# Patient Record
Sex: Female | Born: 1990 | Race: White | Hispanic: No | Smoking: Former smoker
Health system: Southern US, Community
[De-identification: ages and names within clinical notes are randomized; demographics above are authoritative.]

## PROBLEM LIST (undated history)

## (undated) DIAGNOSIS — R350 Frequency of micturition: Secondary | ICD-10-CM

## (undated) DIAGNOSIS — Z8742 Personal history of other diseases of the female genital tract: Secondary | ICD-10-CM

## (undated) DIAGNOSIS — R3915 Urgency of urination: Secondary | ICD-10-CM

## (undated) DIAGNOSIS — Z973 Presence of spectacles and contact lenses: Secondary | ICD-10-CM

## (undated) DIAGNOSIS — N200 Calculus of kidney: Secondary | ICD-10-CM

## (undated) DIAGNOSIS — N201 Calculus of ureter: Secondary | ICD-10-CM

## (undated) DIAGNOSIS — Z8709 Personal history of other diseases of the respiratory system: Secondary | ICD-10-CM

## (undated) DIAGNOSIS — Z87442 Personal history of urinary calculi: Secondary | ICD-10-CM

## (undated) DIAGNOSIS — K219 Gastro-esophageal reflux disease without esophagitis: Secondary | ICD-10-CM

## (undated) HISTORY — PX: APPENDECTOMY: SHX54

## (undated) HISTORY — PX: OTHER SURGICAL HISTORY: SHX169

---
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
## 2009-05-23 ENCOUNTER — Encounter (INDEPENDENT_AMBULATORY_CARE_PROVIDER_SITE_OTHER): Payer: Self-pay | Admitting: General Surgery

## 2009-05-23 HISTORY — PX: LAPAROSCOPIC APPENDECTOMY: SUR753

## 2009-05-24 ENCOUNTER — Inpatient Hospital Stay (HOSPITAL_COMMUNITY): Admission: EM | Admit: 2009-05-24 | Discharge: 2009-05-25 | Payer: Self-pay | Admitting: Emergency Medicine

## 2009-07-17 ENCOUNTER — Emergency Department (HOSPITAL_COMMUNITY): Admission: EM | Admit: 2009-07-17 | Discharge: 2009-07-17 | Payer: Self-pay | Admitting: Emergency Medicine

## 2010-01-07 ENCOUNTER — Emergency Department (HOSPITAL_COMMUNITY): Admission: EM | Admit: 2010-01-07 | Discharge: 2010-01-07 | Payer: Self-pay | Admitting: Emergency Medicine

## 2010-01-12 ENCOUNTER — Emergency Department (HOSPITAL_COMMUNITY): Admission: EM | Admit: 2010-01-12 | Discharge: 2010-01-12 | Payer: Self-pay | Admitting: Emergency Medicine

## 2010-01-15 ENCOUNTER — Emergency Department (HOSPITAL_COMMUNITY): Admission: EM | Admit: 2010-01-15 | Discharge: 2010-01-15 | Payer: Self-pay | Admitting: Emergency Medicine

## 2010-01-17 ENCOUNTER — Ambulatory Visit (HOSPITAL_COMMUNITY): Admission: AD | Admit: 2010-01-17 | Discharge: 2010-01-17 | Payer: Self-pay | Admitting: Urology

## 2010-01-17 HISTORY — PX: OTHER SURGICAL HISTORY: SHX169

## 2010-11-27 LAB — COMPREHENSIVE METABOLIC PANEL
AST: 27 U/L (ref 0–37)
Albumin: 3.8 g/dL (ref 3.5–5.2)
Calcium: 9.4 mg/dL (ref 8.4–10.5)
Chloride: 106 mEq/L (ref 96–112)
Creatinine, Ser: 0.57 mg/dL (ref 0.4–1.2)
GFR calc Af Amer: >60 mL/min (ref >60)
Total Bilirubin: 1.2 mg/dL (ref 0.3–1.2)
Total Protein: 6.7 g/dL (ref 6.0–8.3)

## 2010-11-27 LAB — URINALYSIS, ROUTINE W REFLEX MICROSCOPIC
Glucose, UA: NEGATIVE mg/dL
Hgb urine dipstick: NEGATIVE
Ketones, ur: >80 mg/dL — AB
Protein, ur: 30 mg/dL — AB
Protein, ur: NEGATIVE mg/dL
Urobilinogen, UA: 1 mg/dL (ref 0.0–1.0)

## 2010-11-27 LAB — URINE CULTURE
Colony Count: NO GROWTH
Culture: NO GROWTH

## 2010-11-27 LAB — CBC
MCV: 90.9 fL (ref 78.0–100.0)
Platelets: 320 10*3/uL (ref 150–400)
RDW: 14.7 % (ref 11.5–15.5)
WBC: 10.3 10*3/uL (ref 4.0–10.5)

## 2010-11-27 LAB — DIFFERENTIAL
Eosinophils Relative: 2 % (ref 0–5)
Lymphocytes Relative: 25 % (ref 12–46)
Lymphs Abs: 2.6 10*3/uL (ref 0.7–4.0)
Monocytes Absolute: 0.5 10*3/uL (ref 0.1–1.0)
Monocytes Relative: 5 % (ref 3–12)
Neutro Abs: 6.9 10*3/uL (ref 1.7–7.7)

## 2010-11-27 LAB — URINE MICROSCOPIC-ADD ON

## 2010-12-14 LAB — CBC
HCT: 42.8 % (ref 36.0–46.0)
Hemoglobin: 14.6 g/dL (ref 12.0–15.0)
MCHC: 34.3 g/dL (ref 30.0–36.0)
RBC: 4.67 MIL/uL (ref 3.87–5.11)
RDW: 13.1 % (ref 11.5–15.5)

## 2010-12-14 LAB — DIFFERENTIAL
Basophils Relative: 0 % (ref 0–1)
Eosinophils Absolute: 0 10*3/uL (ref 0.0–0.7)
Lymphs Abs: 1.3 10*3/uL (ref 0.7–4.0)
Monocytes Relative: 1 % — ABNORMAL LOW (ref 3–12)
Neutro Abs: 17.9 10*3/uL — ABNORMAL HIGH (ref 1.7–7.7)
Neutrophils Relative %: 93 % — ABNORMAL HIGH (ref 43–77)

## 2010-12-14 LAB — COMPREHENSIVE METABOLIC PANEL
ALT: 14 U/L (ref 0–35)
Alkaline Phosphatase: 63 U/L (ref 39–117)
BUN: 5 mg/dL — ABNORMAL LOW (ref 6–23)
CO2: 22 mEq/L (ref 19–32)
Calcium: 9.7 mg/dL (ref 8.4–10.5)
GFR calc non Af Amer: >60 mL/min (ref >60)
Glucose, Bld: 147 mg/dL — ABNORMAL HIGH (ref 70–99)
Total Protein: 7.6 g/dL (ref 6.0–8.3)

## 2010-12-14 LAB — URINALYSIS, ROUTINE W REFLEX MICROSCOPIC
Glucose, UA: NEGATIVE mg/dL
Ketones, ur: >80 mg/dL — AB
Nitrite: NEGATIVE
Protein, ur: NEGATIVE mg/dL

## 2010-12-14 LAB — POCT PREGNANCY, URINE: Preg Test, Ur: NEGATIVE

## 2010-12-14 LAB — LIPASE, BLOOD: Lipase: 15 U/L (ref 11–59)

## 2011-06-17 LAB — URINE MICROSCOPIC-ADD ON

## 2011-06-17 LAB — URINALYSIS, ROUTINE W REFLEX MICROSCOPIC
Bilirubin Urine: NEGATIVE
Glucose, UA: NEGATIVE
Hgb urine dipstick: NEGATIVE
Ketones, ur: NEGATIVE
Protein, ur: NEGATIVE
pH: 6.5

## 2011-06-27 LAB — POCT RAPID STREP A: Streptococcus, Group A Screen (Direct): NEGATIVE

## 2013-06-19 ENCOUNTER — Encounter (HOSPITAL_COMMUNITY): Payer: Self-pay | Admitting: Emergency Medicine

## 2013-06-19 ENCOUNTER — Emergency Department (HOSPITAL_COMMUNITY)
Admission: EM | Admit: 2013-06-19 | Discharge: 2013-06-19 | Disposition: A | Discharge status: Home / Self Care | Payer: Self-pay | Attending: Emergency Medicine | Admitting: Emergency Medicine

## 2013-06-19 DIAGNOSIS — L03213 Periorbital cellulitis: Secondary | ICD-10-CM

## 2013-06-19 DIAGNOSIS — R599 Enlarged lymph nodes, unspecified: Secondary | ICD-10-CM | POA: Insufficient documentation

## 2013-06-19 DIAGNOSIS — L03211 Cellulitis of face: Secondary | ICD-10-CM | POA: Insufficient documentation

## 2013-06-19 DIAGNOSIS — L0201 Cutaneous abscess of face: Secondary | ICD-10-CM | POA: Insufficient documentation

## 2013-06-19 MED ORDER — CEPHALEXIN 500 MG PO CAPS
500.0000 mg | ORAL_CAPSULE | Freq: Once | ORAL | Status: AC
  Administered 2013-06-19: 500 mg via ORAL
  Filled 2013-06-19: qty 1

## 2013-06-19 MED ORDER — SULFAMETHOXAZOLE-TRIMETHOPRIM 800-160 MG PO TABS
1.0000 | ORAL_TABLET | Freq: Two times a day (BID) | ORAL | Status: DC
Start: 2013-06-19 — End: 2015-12-08

## 2013-06-19 MED ORDER — CEPHALEXIN 500 MG PO CAPS: 500.0000 mg | ORAL_CAPSULE | Freq: Three times a day (TID) | ORAL | Status: DC

## 2013-06-19 MED ORDER — SULFAMETHOXAZOLE-TMP DS 800-160 MG PO TABS
1.0000 | ORAL_TABLET | Freq: Once | ORAL | Status: AC
  Administered 2013-06-19: 1 via ORAL
  Filled 2013-06-19: qty 1

## 2013-06-19 NOTE — ED Notes (Signed)
Pt escorted to discharge window. Verbalized understanding discharge instructions. In no acute distress.   

## 2013-06-19 NOTE — ED Provider Notes (Signed)
CSN: [id number]     Arrival date & time 06/19/13  1325 History   First MD Initiated Contact with Patient 06/19/13 1334     Chief Complaint  Patient presents with  . Eye Problem   (Consider location/radiation/quality/duration/timing/severity/associated sxs/prior Treatment) HPI Comments: Pt woke up had a small pimple at corner of right eye, got much more notibly swollen and sore to touch in the past 1-2 days.  No blurred vision, no pain to eye with eye movement, no fevers, stiff neck.  Pt has also noted a painful lymph node to right side of neck.  No rash.  No drainage from initial lesion.  No pain to jaw, opening mouth.  No h/o abscess or MRSA in the past.  Otherwise is healthy.  No meds taken pta.    Patient is a 22 y.o. female presenting with eye problem. The history is provided by the patient and a relative.  Eye Problem Associated symptoms: no photophobia and no redness     History reviewed. No pertinent past medical history. History reviewed. No pertinent past surgical history. History reviewed. No pertinent family history. History  Substance Use Topics  . Smoking status: Not on file  . Smokeless tobacco: Not on file  . Alcohol Use: Not on file   OB History   Grav Para Term Preterm Abortions TAB SAB Ect Mult Living                 Review of Systems  Constitutional: Negative for fever and chills.  HENT: Negative for sinus pressure, sneezing and sore throat.   Eyes: Positive for pain. Negative for photophobia, redness and visual disturbance.  Respiratory: Negative for shortness of breath.   Skin: Positive for color change and wound.  Hematological: Positive for adenopathy.    Allergies  Review of patient's allergies indicates no known allergies.  Home Medications   Current Outpatient Rx  Name  Route  Sig  Dispense  Refill  . cephALEXin (KEFLEX) 500 MG capsule   Oral   Take 1 capsule (500 mg total) by mouth 3 (three) times daily.   30 capsule   0   .  sulfamethoxazole-trimethoprim (BACTRIM DS,SEPTRA DS) 800-160 MG per tablet   Oral   Take 1 tablet by mouth 2 (two) times daily.   20 tablet   0    BP 125/79  Pulse 97  Resp 16  SpO2 97%  LMP 06/18/2013 Physical Exam  Nursing note and vitals reviewed. Constitutional: She is oriented to person, place, and time. She appears well-developed and well-nourished. No distress.  HENT:  Head: Normocephalic and atraumatic.  Eyes: Conjunctivae and EOM are normal. Right eye exhibits no chemosis and no exudate. No scleral icterus.  Pt with edema, redness, swelling and tenderness to right perioribtal region.  No pain with EOM.  Normal EOM, no blurred or double vision with EOM.    Neck: Normal range of motion. Neck supple.  Pulmonary/Chest: Effort normal. No respiratory distress.  Lymphadenopathy:    She has cervical adenopathy.  Neurological: She is alert and oriented to person, place, and time. She exhibits normal muscle tone. Coordination normal.  Skin: Skin is warm and dry. No rash noted. She is not diaphoretic.  Psychiatric: She has a normal mood and affect.    ED Course  Procedures (including critical care time) Labs Review Labs Reviewed - No data to display Imaging Review No results found.  EKG Interpretation   None       MDM  1. Periorbital cellulitis of right eye    Pt with possibly insect bite or sting, periorbital cellulitis.  Possibel small abscess, but no large discernable area of fluctuance clinically.  Pt understands to watch for high fever, worsening infection despite abx, pain with eye movement, severe HA as signs to return.  Otherwise can follow up with urgent care or her own PMD next week for a recheck.      Gavin Pound. Macalister Arnaud, MD 06/19/13 1346

## 2013-06-19 NOTE — Discharge Instructions (Signed)
Periorbital Cellulitis °Periorbital cellulitis is a common infection that can affect the eyelid and the soft tissues that surround the eyeball. The infection may also affect the structures that produce and drain tears. It does not affect the eyeball itself. Natural tissue barriers usually prevent the spread of this infection to the eyeball and other deeper areas of the eye socket.      °CAUSES °· Bacterial infection. °· Long-term (chronic) sinus infections. °· An object (foreign body) stuck behind the eye. °· An injury that goes through the eyelid tissues. °· An injury that causes an infection, such as an insect sting. °· Fracture of the bone around the eye. °· Infections which have spread from the eyelid or other structures around the eye. °· Bite wounds. °· Inflammation or infection of the lining membranes of the brain (meningitis). °· An infection in the blood (septicemia). °· Dental infection (abscess). °· Viral infection (this is rare). °SYMPTOMS °Symptoms usually come on suddenly. °· Pain in the eye. °· Red, hot, and swollen eyelids and possibly cheeks. The swelling is sometimes bad enough that the eyelids cannot open. Some infections make the eyelids look purple. °· Fever and feeling generally ill. °· Pain when touching the area around the eye. °DIAGNOSIS  °Periorbital cellulitis can be diagnosed from an eye exam. In severe cases, your caregiver might suggest: °· Blood tests. °· Imaging tests (such as a CT scan) to examine the sinuses and the area around and behind the eyeball. °TREATMENT °If your caregiver feels that you do not have any signs of serious infection, treatment may include: °· Antibiotics. °· Nasal decongestants to reduce swelling. °· Referral to a dentist if it is suspected that the infection was caused by a prior tooth infection. °· Examination every day to make sure the problem is improving. °HOME CARE INSTRUCTIONS °· Take your antibiotics as directed. Finish them even if you start to feel  better. °· Some pain is normal with this condition. Take pain medicine as directed by your caregiver. Only take pain medicines approved by your caregiver. °· It is important to drink fluids. Drink enough water and fluids to keep your urine clear or pale yellow. °· Do not smoke. °· Rest and get plenty of sleep. °· Mild or moderate fevers generally have no long-term effects and often do not require treatment. °· If your caregiver has given you a follow-up appointment, it is very important to keep that appointment. Your caregiver will need to make sure that the infection is getting better. It is important to check that a more serious infection is not developing. °SEEK IMMEDIATE MEDICAL CARE IF: °· Your eyelids become more painful, red, warm, or swollen. °· You develop double vision or your vision becomes blurred or worsens in any way. °· You have trouble moving your eyes. °· The eye looks like it is popping out (proptosis). °· You develop a severe headache, severe neck pain, or neck stiffness. °· You develop repeated vomiting. °· You have a fever or persistent symptoms for more than 72 hours. °· You have a fever and your symptoms suddenly get worse. °MAKE SURE YOU: °· Understand these instructions. °· Will watch your condition. °· Will get help right away if you are not doing well or get worse. °Document Released: 09/28/2010 Document Revised: 11/18/2011 Document Reviewed: 09/28/2010 °[employer]® Patient Information ©2014 [employer]. ° °

## 2013-06-19 NOTE — ED Notes (Signed)
She noted a "red spot" near lateral corner of her right eye this Tues., which persists with increasing erythema/edema at this area.  She is in no distress.

## 2015-12-08 ENCOUNTER — Observation Stay (HOSPITAL_COMMUNITY): Payer: Self-pay | Admitting: Anesthesiology

## 2015-12-08 ENCOUNTER — Encounter (HOSPITAL_COMMUNITY)
Admission: EM | Disposition: A | Discharge status: Home / Self Care | Payer: Self-pay | Source: Home / Self Care | Attending: Emergency Medicine

## 2015-12-08 ENCOUNTER — Emergency Department (HOSPITAL_COMMUNITY): Payer: Self-pay

## 2015-12-08 ENCOUNTER — Other Ambulatory Visit: Payer: Self-pay | Admitting: Urology

## 2015-12-08 ENCOUNTER — Observation Stay (HOSPITAL_COMMUNITY)
Admission: EM | Admit: 2015-12-08 | Discharge: 2015-12-08 | Disposition: A | Discharge status: Home / Self Care | Payer: Self-pay | Attending: Urology | Admitting: Urology

## 2015-12-08 ENCOUNTER — Encounter (HOSPITAL_COMMUNITY): Payer: Self-pay

## 2015-12-08 DIAGNOSIS — N132 Hydronephrosis with renal and ureteral calculous obstruction: Principal | ICD-10-CM | POA: Insufficient documentation

## 2015-12-08 DIAGNOSIS — Z79899 Other long term (current) drug therapy: Secondary | ICD-10-CM | POA: Insufficient documentation

## 2015-12-08 DIAGNOSIS — N200 Calculus of kidney: Secondary | ICD-10-CM

## 2015-12-08 DIAGNOSIS — Z87891 Personal history of nicotine dependence: Secondary | ICD-10-CM | POA: Insufficient documentation

## 2015-12-08 DIAGNOSIS — Z87442 Personal history of urinary calculi: Secondary | ICD-10-CM | POA: Insufficient documentation

## 2015-12-08 DIAGNOSIS — Z791 Long term (current) use of non-steroidal anti-inflammatories (NSAID): Secondary | ICD-10-CM | POA: Insufficient documentation

## 2015-12-08 HISTORY — PX: CYSTOSCOPY W/ URETERAL STENT PLACEMENT: SHX1429

## 2015-12-08 LAB — BASIC METABOLIC PANEL
ANION GAP: 11 (ref 5–15)
BUN: 9 mg/dL (ref 6–20)
CALCIUM: 8.9 mg/dL (ref 8.9–10.3)
CO2: 19 mmol/L — ABNORMAL LOW (ref 22–32)
CREATININE: 0.76 mg/dL (ref 0.44–1.00)
Chloride: 108 mmol/L (ref 101–111)
GFR calc Af Amer: >60 mL/min (ref >60)
Glucose, Bld: 114 mg/dL — ABNORMAL HIGH (ref 65–99)
Potassium: 4.4 mmol/L (ref 3.5–5.1)
Sodium: 138 mmol/L (ref 135–145)

## 2015-12-08 LAB — CBC WITH DIFFERENTIAL/PLATELET
BASOS ABS: 0.1 10*3/uL (ref 0.0–0.1)
Basophils Relative: 0 %
Eosinophils Absolute: 0.3 10*3/uL (ref 0.0–0.7)
Eosinophils Relative: 2 %
HCT: 36.4 % (ref 36.0–46.0)
Hemoglobin: 12.8 g/dL (ref 12.0–15.0)
Lymphocytes Relative: 15 %
Lymphs Abs: 2 10*3/uL (ref 0.7–4.0)
MCH: 30.1 pg (ref 26.0–34.0)
MCHC: 35.2 g/dL (ref 30.0–36.0)
MCV: 85.6 fL (ref 78.0–100.0)
Monocytes Absolute: 0.9 10*3/uL (ref 0.1–1.0)
Monocytes Relative: 7 %
Neutro Abs: 10.3 10*3/uL — ABNORMAL HIGH (ref 1.7–7.7)
Neutrophils Relative %: 76 %
PLATELETS: 307 10*3/uL (ref 150–400)
RBC: 4.25 MIL/uL (ref 3.87–5.11)
RDW: 13.4 % (ref 11.5–15.5)
WBC: 13.6 10*3/uL — ABNORMAL HIGH (ref 4.0–10.5)

## 2015-12-08 LAB — URINE MICROSCOPIC-ADD ON

## 2015-12-08 LAB — URINALYSIS, ROUTINE W REFLEX MICROSCOPIC
Bilirubin Urine: NEGATIVE
Glucose, UA: NEGATIVE mg/dL
Ketones, ur: NEGATIVE mg/dL
Nitrite: NEGATIVE
Protein, ur: NEGATIVE mg/dL
Specific Gravity, Urine: 1.014 (ref 1.005–1.030)
pH: 7 (ref 5.0–8.0)

## 2015-12-08 LAB — PREGNANCY, URINE: Preg Test, Ur: NEGATIVE

## 2015-12-08 SURGERY — CYSTOSCOPY, WITH RETROGRADE PYELOGRAM AND URETERAL STENT INSERTION
Anesthesia: General | Site: Ureter | Laterality: Right

## 2015-12-08 MED ORDER — CIPROFLOXACIN HCL 500 MG PO TABS: 500.0000 mg | ORAL_TABLET | Freq: Two times a day (BID) | ORAL | Status: DC

## 2015-12-08 MED ORDER — DEXTROSE 5 % IV SOLN
1.0000 g | Freq: Once | INTRAVENOUS | Status: AC
Start: 2015-12-08 — End: 2015-12-08
  Administered 2015-12-08: 1 g via INTRAVENOUS
  Filled 2015-12-08: qty 10

## 2015-12-08 MED ORDER — FENTANYL CITRATE (PF) 100 MCG/2ML IJ SOLN
INTRAMUSCULAR | Status: AC
  Filled 2015-12-08: qty 2

## 2015-12-08 MED ORDER — FENTANYL CITRATE (PF) 100 MCG/2ML IJ SOLN
INTRAMUSCULAR | Status: DC | PRN
  Administered 2015-12-08: 50 ug via INTRAVENOUS
  Administered 2015-12-08: 25 ug via INTRAVENOUS
  Administered 2015-12-08 (×2): 50 ug via INTRAVENOUS
  Administered 2015-12-08: 25 ug via INTRAVENOUS

## 2015-12-08 MED ORDER — ONDANSETRON HCL 4 MG/2ML IJ SOLN
INTRAMUSCULAR | Status: DC | PRN
  Administered 2015-12-08: 4 mg via INTRAVENOUS

## 2015-12-08 MED ORDER — MIDAZOLAM HCL 5 MG/5ML IJ SOLN
INTRAMUSCULAR | Status: DC | PRN
  Administered 2015-12-08 (×2): 1 mg via INTRAVENOUS

## 2015-12-08 MED ORDER — LIDOCAINE HCL (CARDIAC) 20 MG/ML IV SOLN
INTRAVENOUS | Status: DC | PRN
  Administered 2015-12-08: 50 mg via INTRAVENOUS

## 2015-12-08 MED ORDER — MIDAZOLAM HCL 2 MG/2ML IJ SOLN
INTRAMUSCULAR | Status: AC
  Filled 2015-12-08: qty 2

## 2015-12-08 MED ORDER — KETOROLAC TROMETHAMINE 30 MG/ML IJ SOLN
30.0000 mg | Freq: Once | INTRAMUSCULAR | Status: AC
  Administered 2015-12-08: 30 mg via INTRAVENOUS
  Filled 2015-12-08: qty 1

## 2015-12-08 MED ORDER — PROPOFOL 10 MG/ML IV BOLUS
INTRAVENOUS | Status: AC
  Filled 2015-12-08: qty 20

## 2015-12-08 MED ORDER — METOCLOPRAMIDE HCL 5 MG/ML IJ SOLN
INTRAMUSCULAR | Status: AC
  Filled 2015-12-08: qty 2

## 2015-12-08 MED ORDER — IOPAMIDOL (ISOVUE-300) INJECTION 61%
INTRAVENOUS | Status: AC
  Filled 2015-12-08: qty 50

## 2015-12-08 MED ORDER — FENTANYL CITRATE (PF) 100 MCG/2ML IJ SOLN
25.0000 ug | INTRAMUSCULAR | Status: DC | PRN
  Administered 2015-12-08 (×3): 50 ug via INTRAVENOUS

## 2015-12-08 MED ORDER — LIDOCAINE HCL (CARDIAC) 20 MG/ML IV SOLN
INTRAVENOUS | Status: AC
  Filled 2015-12-08: qty 5

## 2015-12-08 MED ORDER — 0.9 % SODIUM CHLORIDE (POUR BTL) OPTIME
TOPICAL | Status: DC | PRN
  Administered 2015-12-08: 1000 mL

## 2015-12-08 MED ORDER — OXYCODONE-ACETAMINOPHEN 5-325 MG PO TABS: 1.0000 | ORAL_TABLET | ORAL | Status: DC | PRN

## 2015-12-08 MED ORDER — ONDANSETRON HCL 4 MG/2ML IJ SOLN
4.0000 mg | Freq: Once | INTRAMUSCULAR | Status: AC
  Administered 2015-12-08: 4 mg via INTRAVENOUS
  Filled 2015-12-08: qty 2

## 2015-12-08 MED ORDER — ONDANSETRON HCL 4 MG/2ML IJ SOLN: 4.0000 mg | Freq: Once | INTRAMUSCULAR | Status: DC | PRN

## 2015-12-08 MED ORDER — SUCCINYLCHOLINE CHLORIDE 20 MG/ML IJ SOLN
INTRAMUSCULAR | Status: DC | PRN
  Administered 2015-12-08: 100 mg via INTRAVENOUS

## 2015-12-08 MED ORDER — ONDANSETRON HCL 4 MG/2ML IJ SOLN
INTRAMUSCULAR | Status: AC
  Filled 2015-12-08: qty 2

## 2015-12-08 MED ORDER — DEXAMETHASONE SODIUM PHOSPHATE 10 MG/ML IJ SOLN
INTRAMUSCULAR | Status: AC
  Filled 2015-12-08: qty 1

## 2015-12-08 MED ORDER — SODIUM CHLORIDE 0.9 % IV BOLUS (SEPSIS)
1000.0000 mL | Freq: Once | INTRAVENOUS | Status: AC
  Administered 2015-12-08: 1000 mL via INTRAVENOUS

## 2015-12-08 MED ORDER — METOCLOPRAMIDE HCL 5 MG/ML IJ SOLN
INTRAMUSCULAR | Status: DC | PRN
  Administered 2015-12-08: 10 mg via INTRAVENOUS

## 2015-12-08 MED ORDER — SODIUM CHLORIDE 0.9 % IR SOLN
Status: DC | PRN
  Administered 2015-12-08: 3000 mL

## 2015-12-08 MED ORDER — FENTANYL CITRATE (PF) 100 MCG/2ML IJ SOLN
50.0000 ug | Freq: Once | INTRAMUSCULAR | Status: AC
  Administered 2015-12-08: 50 ug via INTRAVENOUS
  Filled 2015-12-08: qty 2

## 2015-12-08 MED ORDER — PROPOFOL 10 MG/ML IV BOLUS
INTRAVENOUS | Status: DC | PRN
  Administered 2015-12-08: 180 mg via INTRAVENOUS

## 2015-12-08 MED ORDER — CEFAZOLIN SODIUM-DEXTROSE 2-4 GM/100ML-% IV SOLN
2.0000 g | INTRAVENOUS | Status: DC
  Filled 2015-12-08: qty 100

## 2015-12-08 MED ORDER — DEXAMETHASONE SODIUM PHOSPHATE 4 MG/ML IJ SOLN
INTRAMUSCULAR | Status: DC | PRN
  Administered 2015-12-08: 10 mg via INTRAVENOUS

## 2015-12-08 MED ORDER — LACTATED RINGERS IV SOLN
INTRAVENOUS | Status: DC
  Administered 2015-12-08: 1000 mL via INTRAVENOUS

## 2015-12-08 MED ORDER — IOPAMIDOL (ISOVUE-300) INJECTION 61%
INTRAVENOUS | Status: DC | PRN
  Administered 2015-12-08: 7.5 mL

## 2015-12-08 SURGICAL SUPPLY — 12 items
BAG URO CATCHER STRL LF (MISCELLANEOUS) ×3 IMPLANT
CATH INTERMIT  6FR 70CM (CATHETERS) ×2 IMPLANT
CLOTH BEACON ORANGE TIMEOUT ST (SAFETY) ×3 IMPLANT
GLOVE BIOGEL M STRL SZ7.5 (GLOVE) ×3 IMPLANT
GOWN STRL REUS W/TWL LRG LVL3 (GOWN DISPOSABLE) ×6 IMPLANT
GUIDEWIRE ANG ZIPWIRE 038X150 (WIRE) IMPLANT
GUIDEWIRE STR DUAL SENSOR (WIRE) ×2 IMPLANT
MANIFOLD NEPTUNE II (INSTRUMENTS) ×3 IMPLANT
PACK CYSTO (CUSTOM PROCEDURE TRAY) ×3 IMPLANT
STENT URET 6FRX26 CONTOUR (STENTS) ×2 IMPLANT
TUBING CONNECTING 10 (TUBING) ×2 IMPLANT
TUBING CONNECTING 10' (TUBING) ×1

## 2015-12-08 NOTE — Op Note (Signed)
Date of procedure: 12/08/2015  Preoperative diagnosis:  1. Right ureteral stone 2. Possible urinary tract infection   Postoperative diagnosis:  1. Right ureteral stone 2. Possible urinary tract infection   Procedure: 1. Cystoscopy 2. Right retrograde pyelogram with interpretation 3. Right ureteral stent placement 6 French by 26 cm  Surgeon: Baruch Gouty, MD  Anesthesia: General  Complications: None  Intraoperative findings: The patient has significant right hydronephrotic drip after placing a catheter in the proximal ureter. This was confirmed to be in the correct location on fluoroscopy. Retrograde program showed significant hydroureter nephrosis.  EBL: None  Specimens: Urine culture from right ureter  Drains: 6 French by 26 cm right double-J ureteral stent  Disposition: Stable to the postanesthesia care unit  Indication for procedure: The patient is a 25 y.o. female with a 1.2 cm mid right ureteral stone with possible UTI presents today for stent placement.  After reviewing the management options for treatment, the patient elected to proceed with the above surgical procedure(s). We have discussed the potential benefits and risks of the procedure, side effects of the proposed treatment, the likelihood of the patient achieving the goals of the procedure, and any potential problems that might occur during the procedure or recuperation. Informed consent has been obtained.  Description of procedure: The patient was met in the preoperative area. All risks, benefits, and indications of the procedure were described in great detail. The patient consented to the procedure. Preoperative antibiotics were given. The patient was taken to the operative theater. General anesthesia was induced per the anesthesia service. The patient was then placed in the dorsal lithotomy position and prepped and draped in the usual sterile fashion. A preoperative timeout was called. 21 French 30 cystoscope was  inserted into the patient's bladder per urethra atraumatically. Pan cystoscopy revealed the right ureteral orifice which was intubated the sensor wire which was passed up to level of the right renal pelvis under fluoroscopy. A opening catheter was then advanced to the level of the renal pelvis and the sensor wire was removed. A hydronephrotic drip then occurred for approximately 3 minutes to drainage entirety. This urine was sent for culture. We will pressure right retrograde pyelogram was then obtained to identify the renal pelvis. The sensor wire was then placed up to the level of the renal pelvis through the catheter and the catheter was removed. A 6 French by 26 and a right double-J ureteral stent was then placed a sensor wire was removed. The stent was confirmed to be necrotic location the curl seen in the patient's right renal pelvis on fluoroscopy and the urinary bladder under visualization. Medially upon placing the stent there is drainage of clear yellow urine. There is no evidence of purulent drainage. The patient bladder was drained. She smoked from anesthesia and transferred in stable condition to the post care unit.  Plan: The patient follow-up in 2 weeks for definitive stone management after negative urine culture.  Baruch Gouty, M.D.

## 2015-12-08 NOTE — Anesthesia Procedure Notes (Signed)
Procedure Name: Intubation Date/Time: 12/08/2015 2:07 PM Performed by: Ludwig LeanJONES, Zakaiya Lares C Pre-anesthesia Checklist: Patient identified, Emergency Drugs available, Suction available and Patient being monitored Patient Re-evaluated:Patient Re-evaluated prior to inductionOxygen Delivery Method: Circle System Utilized Preoxygenation: Pre-oxygenation with 100% oxygen Intubation Type: IV induction Ventilation: Mask ventilation without difficulty Laryngoscope Size: Mac and 3 Grade View: Grade I Tube type: Oral Number of attempts: 1 Airway Equipment and Method: Oral airway Placement Confirmation: ETT inserted through vocal cords under direct vision,  positive ETCO2 and breath sounds checked- equal and bilateral Secured at: 21 cm Tube secured with: Tape Dental Injury: Teeth and Oropharynx as per pre-operative assessment

## 2015-12-08 NOTE — ED Notes (Signed)
Pt. Reports having rt. Flank pain that began approximately 1 month ago and has progressively become worse.  She has intermittent nausea. She denies any vaginal pain or discharge.  She denies any dysuria.  She has a hx of kidney stones.

## 2015-12-08 NOTE — ED Notes (Addendum)
Pt friend Jill Sidelison to take all of patient belongings home.

## 2015-12-08 NOTE — Transfer of Care (Signed)
Immediate Anesthesia Transfer of Care Note  Patient: Elizabeth Joyce  Procedure(s) Performed: Procedure(s): CYSTOSCOPY WITH RETROGRADE PYELOGRAM/ RIGHT URETERAL STENT PLACEMENT (Right)  Patient Location: PACU  Anesthesia Type:General  Level of Consciousness: awake, alert  and patient cooperative  Airway & Oxygen Therapy: Patient Spontanous Breathing, Patient connected to face mask oxygen and patient teary, crying.  Post-op Assessment: Report given to RN and Post -op Vital signs reviewed and stable  Post vital signs: Reviewed and stable  Last Vitals:  Filed Vitals:   12/08/15 1245 12/08/15 1251  BP: 108/73 120/68  Pulse: 71   Temp:  36.7 C  Resp: 17 18    Complications: No apparent anesthesia complications

## 2015-12-08 NOTE — H&P (Addendum)
12:22 PM   Elizabeth Joyce 1991/06/26 [id number]  Referring provider: Dr. Donnetta Hutching  Chief Complaint  Patient presents with  . Flank Pain    HPI: The patient is a 25 year old female who presented to Hospital and was diagnosed with a 12 mm right mid ureteral calculus with severe hydroureteronephrosis proximal. She is an elevated white blood cell count and urinalysis possibly concerning for infection.  She also has a 2 mm left lower pole nonobstructing stone. She has had kidney stones in the past 6 years ago that required intervention.   PMH: Past Medical History  Diagnosis Date  . Kidney stone   . Asthma     Surgical History: Past Surgical History  Procedure Laterality Date  . Appendectomy      Home Medications:    Medication List    ASK your doctor about these medications        cephALEXin 500 MG capsule  Commonly known as:  KEFLEX  Take 1 capsule (500 mg total) by mouth 3 (three) times daily.     ibuprofen 200 MG tablet  Commonly known as:  ADVIL,MOTRIN  Take 200 mg by mouth every 6 (six) hours as needed for pain. For menstrual pain     sulfamethoxazole-trimethoprim 800-160 MG tablet  Commonly known as:  BACTRIM DS,SEPTRA DS  Take 1 tablet by mouth 2 (two) times daily.        Allergies: No Known Allergies  Family History: No family history on file.  Social History:  reports that she has quit smoking. She does not have any smokeless tobacco history on file. She reports that she does not drink alcohol or use illicit drugs.  ROS: 12 point ROS negative except per HPI                                         Physical Exam: BP 117/67 mmHg  Pulse 69  Temp(Src) 98.1 F (36.7 C) (Oral)  Resp 16  Ht  (1.676 m)  Wt 155 lb (70.308 kg)  BMI 25.03 kg/m2  SpO2 95%  LMP 11/07/2015  Constitutional:  Alert and oriented, No acute distress. HEENT: [place] AT, moist mucus membranes.  Trachea midline, no masses. Cardiovascular: No clubbing,  cyanosis, or edema. Respiratory: Normal respiratory effort, no increased work of breathing. GI: Abdomen is soft, nontender, nondistended, no abdominal masses GU: Right CVA tenderness to palpation Skin: No rashes, bruises or suspicious lesions. Lymph: No cervical or inguinal adenopathy. Neurologic: Grossly intact, no focal deficits, moving all 4 extremities. Psychiatric: Normal mood and affect.  Laboratory Data: Lab Results  Component Value Date   WBC 13.6* 12/08/2015   HGB 12.8 12/08/2015   HCT 36.4 12/08/2015   MCV 85.6 12/08/2015   PLT 307 12/08/2015    Lab Results  Component Value Date   CREATININE 0.76 12/08/2015    No results found for: PSA  No results found for: TESTOSTERONE  No results found for: HGBA1C  Urinalysis    Component Value Date/Time   COLORURINE YELLOW 12/08/2015 0910   APPEARANCEUR CLOUDY* 12/08/2015 0910   LABSPEC 1.014 12/08/2015 0910   PHURINE 7.0 12/08/2015 0910   GLUCOSEU NEGATIVE 12/08/2015 0910   HGBUR TRACE* 12/08/2015 0910   BILIRUBINUR NEGATIVE 12/08/2015 0910   KETONESUR NEGATIVE 12/08/2015 0910   PROTEINUR NEGATIVE 12/08/2015 0910   UROBILINOGEN 1.0 01/12/2010 1617   NITRITE NEGATIVE 12/08/2015 0910   LEUKOCYTESUR  LARGE* 12/08/2015 0910    Pertinent Imaging: CLINICAL DATA: 25 year old female with right flank and lumbar back pain for 2 weeks. Personal history of nephrolithiasis. Initial encounter.  EXAM: CT ABDOMEN AND PELVIS WITHOUT CONTRAST  TECHNIQUE: Multidetector CT imaging of the abdomen and pelvis was performed following the standard protocol without IV contrast.  COMPARISON: Noncontrast CT Abdomen and Pelvis 01/12/2010.  FINDINGS: Negative lung bases. No pericardial or pleural effusion.  No acute osseous abnormality identified. Probable benign hemangioma right sacral ala. No other osseous abnormality identified.  Small volume pelvic free fluid in the cul-de-sac and on the left. Negative noncontrast  appearance of the uterus and adnexa, probable small physiologic ovarian cyst on the right. Decompressed distal colon.  Negative sigmoid and left colon. Negative transverse colon. Negative right colon. Sequelae of appendectomy. Negative terminal ileum. No dilated small bowel. Decompressed stomach and duodenum.  No abdominal free air or free fluid. Negative noncontrast liver, gallbladder, spleen, pancreas and adrenal glands.  2-3 mm nonobstructing left lower pole renal calculus. Otherwise negative noncontrast appearance of the left kidney and left ureter. Decompressed urinary bladder.  Severe right nephro megaly and hydronephrosis. Moderate to severe right perinephric inflammatory stranding. Mild regional mass effect due to the renal enlargement. Within the right renal lower pole there is a punctate calculus. Dilated right renal pelvis and ureter to the level of a large oval 7 x 7 x 12 mm obstructing calculus at the S1-S2 level, 11 cm distal to the right ureteral pelvic junction. Distal to this calculus the right ureter is decompressed and difficult to delineate. No other urologic calculus.  IMPRESSION: 1. Severe obstructive uropathy on the right with nephromegaly and perinephric inflammation. Bulky 7 x 7 x 12 mm right ureteral calculus at the S1-S2 level, 11 cm distal to the right UPJ. 2. Small lower pole nonobstructing calculi. 3. Otherwise negative noncontrast CT Abdomen and Pelvis.  Assessment & Plan:    I discussed with the patient that there is concern that his stone is in the setting of a urinary tract infection. I discussed the recommendation for cystoscopy, right retrograde pyelogram, right ureteral stent placement to prevent sepsis. Shee understands that we will not be able to remove the stone today as that could make the infection worse. She understands the risks, benefits, and indications of this procedure and wishes to proceed.   1. Right ureteral calculus 2.  Possible UTI 3. Left lower pole non-obstructing 2 mm calculus -cystoscopy, right retrograde pyelogram, right ureteral stent placement   Hildred LaserBrian James Pia Jedlicka, MD

## 2015-12-08 NOTE — ED Notes (Signed)
Pt. Was updated on plan of care and verbalized understanding.  [facility]  Pt. Verbalized understanding.

## 2015-12-08 NOTE — ED Provider Notes (Addendum)
CSN: [id number]     Arrival date & time 12/08/15  0847 History   First MD Initiated Contact with Patient 12/08/15 [phone]     Chief Complaint  Patient presents with  . Flank Pain     (Consider location/radiation/quality/duration/timing/severity/associated sxs/prior Treatment) HPI...Marland Kitchen.Marland Kitchen.Right-sided flank pain for approximately 1 week. No fever, sweats, chills, dysuria, hematuria. Decreased oral intake. Patient apparently had a kidney stone at age 25 requiring a stent, but this is not documented in Epic. Past medical history includes asthma. Severity of symptoms moderate.  Past Medical History  Diagnosis Date  . Kidney stone   . Asthma    Past Surgical History  Procedure Laterality Date  . Appendectomy     No family history on file. Social History  Substance Use Topics  . Smoking status: Former Games developermoker  . Smokeless tobacco: None  . Alcohol Use: No   OB History    No data available     Review of Systems  All other systems reviewed and are negative.     Allergies  Review of patient's allergies indicates no known allergies.  Home Medications   Prior to Admission medications   Medication Sig Start Date End Date Taking? Authorizing Provider  cephALEXin (KEFLEX) 500 MG capsule Take 1 capsule (500 mg total) by mouth 3 (three) times daily. Patient not taking: Reported on 12/08/2015 06/19/13   Quita SkyeMichael Ghim, MD  ibuprofen (ADVIL,MOTRIN) 200 MG tablet Take 200 mg by mouth every 6 (six) hours as needed for pain. For menstrual pain    Historical Provider, MD  sulfamethoxazole-trimethoprim (BACTRIM DS,SEPTRA DS) 800-160 MG per tablet Take 1 tablet by mouth 2 (two) times daily. Patient not taking: Reported on 12/08/2015 06/19/13   Quita SkyeMichael Ghim, MD   BP 117/97 mmHg  Pulse 56  Temp(Src) 98.1 F (36.7 C) (Oral)  Resp 16  Ht 5\' 6"  (1.676 m)  Wt 155 lb (70.308 kg)  BMI 25.03 kg/m2  SpO2 97%  LMP 11/07/2015 Physical Exam  Constitutional: She is oriented to person, place, and time. She  appears well-developed and well-nourished.  HENT:  Head: Normocephalic and atraumatic.  Eyes: Conjunctivae and EOM are normal. Pupils are equal, round, and reactive to light.  Neck: Normal range of motion. Neck supple.  Cardiovascular: Normal rate and regular rhythm.   Pulmonary/Chest: Effort normal and breath sounds normal.  Abdominal: Soft. Bowel sounds are normal.  Genitourinary:  Tender right flank  Musculoskeletal: Normal range of motion.  Neurological: She is alert and oriented to person, place, and time.  Skin: Skin is warm and dry.  Psychiatric: She has a normal mood and affect. Her behavior is normal.  Nursing note and vitals reviewed.   ED Course  Procedures (including critical care time) Labs Review Labs Reviewed  CBC WITH DIFFERENTIAL/PLATELET - Abnormal; Notable for the following:    WBC 13.6 (*)    Neutro Abs 10.3 (*)    All other components within normal limits  BASIC METABOLIC PANEL - Abnormal; Notable for the following:    CO2 19 (*)    Glucose, Bld 114 (*)    All other components within normal limits  URINALYSIS, ROUTINE W REFLEX MICROSCOPIC (NOT AT [facility]) - Abnormal; Notable for the following:    APPearance CLOUDY (*)    Hgb urine dipstick TRACE (*)    Leukocytes, UA LARGE (*)    All other components within normal limits  URINE MICROSCOPIC-ADD ON - Abnormal; Notable for the following:    Squamous Epithelial / LPF 6-30 (*)  Bacteria, UA FEW (*)    All other components within normal limits  URINE CULTURE  PREGNANCY, URINE    Imaging Review Ct Renal Stone Study  12/08/2015  CLINICAL DATA:  25 year old female with right flank and lumbar back pain for 2 weeks. Personal history of nephrolithiasis. Initial encounter. EXAM: CT ABDOMEN AND PELVIS WITHOUT CONTRAST TECHNIQUE: Multidetector CT imaging of the abdomen and pelvis was performed following the standard protocol without IV contrast. COMPARISON:  Noncontrast CT Abdomen and Pelvis 01/12/2010. FINDINGS:  Negative lung bases.  No pericardial or pleural effusion. No acute osseous abnormality identified. Probable benign hemangioma right sacral ala. No other osseous abnormality identified. Small volume pelvic free fluid in the cul-de-sac and on the left. Negative noncontrast appearance of the uterus and adnexa, probable small physiologic ovarian cyst on the right. Decompressed distal colon. Negative sigmoid and left colon. Negative transverse colon. Negative right colon. Sequelae of appendectomy. Negative terminal ileum. No dilated small bowel. Decompressed stomach and duodenum. No abdominal free air or free fluid. Negative noncontrast liver, gallbladder, spleen, pancreas and adrenal glands. 2-3 mm nonobstructing left lower pole renal calculus. Otherwise negative noncontrast appearance of the left kidney and left ureter. Decompressed urinary bladder. Severe right nephro megaly and hydronephrosis. Moderate to severe right perinephric inflammatory stranding. Mild regional mass effect due to the renal enlargement. Within the right renal lower pole there is a punctate calculus. Dilated right renal pelvis and ureter to the level of a large oval 7 x 7 x 12 mm obstructing calculus at the S1-S2 level, 11 cm distal to the right ureteral pelvic junction. Distal to this calculus the right ureter is decompressed and difficult to delineate. No other urologic calculus. IMPRESSION: 1. Severe obstructive uropathy on the right with nephromegaly and perinephric inflammation. Bulky 7 x 7 x 12 mm right ureteral calculus at the S1-S2 level, 11 cm distal to the right UPJ. 2. Small lower pole nonobstructing calculi. 3. Otherwise negative noncontrast CT Abdomen and Pelvis. Electronically Signed   By: Odessa Fleming M.D.   On: 12/08/2015 10:46   I have personally reviewed and evaluated these images and lab results as part of my medical decision-making.   EKG Interpretation None      MDM   Final diagnoses:  Right kidney stone    Renal CT  reveals a 7 x 7 x 12 mm calculus at the S1-S2 level 11 cm distal to the UPJ.  There is also evidence of a urinary infection. IV Rocephin. Will discuss with urology. 12 00:   Discussed with urologist. Transfer to [facility].   Donnetta Hutching, MD 12/08/15 1100  Donnetta Hutching, MD 12/08/15 [phone]

## 2015-12-08 NOTE — Anesthesia Postprocedure Evaluation (Signed)
Anesthesia Post Note  Patient: Elizabeth Joyce  Procedure(s) Performed: Procedure(s) (LRB): CYSTOSCOPY WITH RETROGRADE PYELOGRAM/ RIGHT URETERAL STENT PLACEMENT (Right)  Patient location during evaluation: PACU Anesthesia Type: General Level of consciousness: awake and alert Pain management: pain level controlled Vital Signs Assessment: post-procedure vital signs reviewed and stable Respiratory status: spontaneous breathing, nonlabored ventilation, respiratory function stable and patient connected to nasal cannula oxygen Cardiovascular status: blood pressure returned to baseline and stable Postop Assessment: no signs of nausea or vomiting Anesthetic complications: no    Last Vitals:  Filed Vitals:   12/08/15 1530 12/08/15 1545  BP:  105/66  Pulse: 73 81  Temp:  36.7 C  Resp: 12 15    Last Pain:  Filed Vitals:   12/08/15 1553  PainSc: 3                  Cecile HearingStephen Edward Turk

## 2015-12-08 NOTE — Discharge Instructions (Signed)
Cystoscopy, Care After °Refer to this sheet in the next few weeks. These instructions provide you with information on caring for yourself after your procedure. Your caregiver may also give you more specific instructions. Your treatment has been planned according to current medical practices, but problems sometimes occur. Call your caregiver if you have any problems or questions after your procedure. °HOME CARE INSTRUCTIONS  °Things you can do to ease any discomfort after your procedure include: °· Drinking enough water and fluids to keep your urine clear or pale yellow. °· Taking a warm bath to relieve any burning feelings. °SEEK IMMEDIATE MEDICAL CARE IF:  °· You have an increase in blood in your urine. °· You notice blood clots in your urine. °· You have difficulty passing urine. °· You have the chills. °· You have abdominal pain. °· You have a fever or persistent symptoms for more than 2-3 days. °· You have a fever and your symptoms suddenly get worse. °MAKE SURE YOU:  °· Understand these instructions. °· Will watch your condition. °· Will get help right away if you are not doing well or get worse. °  °This information is not intended to replace advice given to you by your health care provider. Make sure you discuss any questions you have with your health care provider. °  °Document Released: 03/15/2005 Document Revised: 09/16/2014 Document Reviewed: 02/17/2012 °Elsevier Interactive Patient Education ©2016 [employer] °Ureteral Stent Implantation, Care After °Refer to this sheet in the next few weeks. These instructions provide you with information on caring for yourself after your procedure. Your health care provider may also give you more specific instructions. Your treatment has been planned according to current medical practices, but problems sometimes occur. Call your health care provider if you have any problems or questions after your procedure. °WHAT TO EXPECT AFTER THE PROCEDURE °You should be back to  normal activity within 48 hours after the procedure. Nausea and vomiting may occur and are commonly the result of anesthesia. °It is common to experience sharp pain in the back or lower abdomen and penis with voiding. This is caused by movement of the ends of the stent with the act of urinating. It usually goes away within minutes after you have stopped urinating. °HOME CARE INSTRUCTIONS °Make sure to drink plenty of fluids. You may have small amounts of bleeding, causing your urine to be red. This is normal. Certain movements may trigger pain or a feeling that you need to urinate. You may be given medicines to prevent infection or bladder spasms. Be sure to take all medicines as directed. Only take over-the-counter or prescription medicines for pain, discomfort, or fever as directed by your health care provider. Do not take aspirin, as this can make bleeding worse. °Your stent will be left in until the blockage is resolved. This may take 2 weeks or longer, depending on the reason for stent implantation. You may have an X-ray exam to make sure your ureter is open and that the stent has not moved out of position (migrated). The stent can be removed by your health care provider in the office. Medicines may be given for comfort while the stent is being removed. Be sure to keep all follow-up appointments so your health care provider can check that you are healing properly. °SEEK MEDICAL CARE IF: °· You experience increasing pain. °· Your pain medicine is not working. °SEEK IMMEDIATE MEDICAL CARE IF: °· Your urine is dark red or has blood clots. °· You are leaking urine (incontinent). °·   You have a fever, chills, feeling sick to your stomach (nausea), or vomiting. °· Your pain is not relieved by pain medicine. °· The end of the stent comes out of the urethra. °· You are unable to urinate. °  °This information is not intended to replace advice given to you by your health care provider. Make sure you discuss any questions  you have with your health care provider. °  °Document Released: 04/28/2013 Document Revised: 08/31/2013 Document Reviewed: 03/10/2015 °Elsevier Interactive Patient Education ©2016 Elsevier Inc. ° °

## 2015-12-08 NOTE — ED Notes (Signed)
Pt ambulated to room, pt ambulated to restroom.

## 2015-12-08 NOTE — Anesthesia Preprocedure Evaluation (Signed)
Anesthesia Evaluation  Patient identified by MRN, date of birth, ID band Patient awake    Reviewed: Allergy & Precautions, NPO status , Patient's Chart, lab work & pertinent test results  Airway Mallampati: I  TM Distance: >3 FB Neck ROM: Full    Dental  (+) Teeth Intact, Dental Advisory Given   Pulmonary asthma , former smoker,    Pulmonary exam normal breath sounds clear to auscultation       Cardiovascular Exercise Tolerance: Good negative cardio ROS Normal cardiovascular exam Rhythm:Regular Rate:Normal     Neuro/Psych negative neurological ROS  negative psych ROS   GI/Hepatic negative GI ROS, Neg liver ROS,   Endo/Other  negative endocrine ROS  Renal/GU Renal disease     Musculoskeletal negative musculoskeletal ROS (+)   Abdominal   Peds  Hematology negative hematology ROS (+)   Anesthesia Other Findings Day of surgery medications reviewed with the patient.  Reproductive/Obstetrics negative OB ROS                             Anesthesia Physical Anesthesia Plan  ASA: II  Anesthesia Plan: General   Post-op Pain Management:    Induction: Intravenous  Airway Management Planned: Oral ETT  Additional Equipment:   Intra-op Plan:   Post-operative Plan: Extubation in OR  Informed Consent: I have reviewed the patients History and Physical, chart, labs and discussed the procedure including the risks, benefits and alternatives for the proposed anesthesia with the patient or authorized representative who has indicated his/her understanding and acceptance.   Dental advisory given  Plan Discussed with: CRNA  Anesthesia Plan Comments: (Risks/benefits of general anesthesia discussed with patient including risk of damage to teeth, lips, gum, and tongue, nausea/vomiting, allergic reactions to medications, and the possibility of heart attack, stroke and death.  All patient questions  answered.  Patient wishes to proceed.)        Anesthesia Quick Evaluation

## 2015-12-08 NOTE — ED Notes (Signed)
Pt transported to CT ?

## 2015-12-09 LAB — URINE CULTURE

## 2015-12-10 LAB — URINE CULTURE: CULTURE: NO GROWTH

## 2015-12-11 ENCOUNTER — Encounter (HOSPITAL_COMMUNITY): Payer: Self-pay | Admitting: Urology

## 2015-12-12 ENCOUNTER — Other Ambulatory Visit: Payer: Self-pay | Admitting: Urology

## 2015-12-19 ENCOUNTER — Encounter (HOSPITAL_BASED_OUTPATIENT_CLINIC_OR_DEPARTMENT_OTHER): Payer: Self-pay | Admitting: *Deleted

## 2015-12-19 NOTE — Progress Notes (Signed)
NPO AFTER MN WITH EXCEPTION CLEAR LIQUIDS UNTIL 0700 (NO CREAM/ MILK PRODUCTS).  ARRIVE AT 1100.  CURRENT LAB RESULTS IN CHART AND EPIC.  MAY TAKE OXYCODONE/ DITROPAN IF NEEDED W/ SIPS OF WATER AM DOS.

## 2015-12-26 ENCOUNTER — Ambulatory Visit (HOSPITAL_BASED_OUTPATIENT_CLINIC_OR_DEPARTMENT_OTHER): Payer: Self-pay | Admitting: Anesthesiology

## 2015-12-26 ENCOUNTER — Encounter (HOSPITAL_BASED_OUTPATIENT_CLINIC_OR_DEPARTMENT_OTHER): Payer: Self-pay | Admitting: *Deleted

## 2015-12-26 ENCOUNTER — Ambulatory Visit (HOSPITAL_BASED_OUTPATIENT_CLINIC_OR_DEPARTMENT_OTHER)
Admission: RE | Admit: 2015-12-26 | Discharge: 2015-12-26 | Disposition: A | Discharge status: Home / Self Care | Payer: Self-pay | Source: Ambulatory Visit | Attending: Urology | Admitting: Urology

## 2015-12-26 ENCOUNTER — Encounter (HOSPITAL_BASED_OUTPATIENT_CLINIC_OR_DEPARTMENT_OTHER)
Admission: RE | Disposition: A | Discharge status: Home / Self Care | Payer: Self-pay | Source: Ambulatory Visit | Attending: Urology

## 2015-12-26 DIAGNOSIS — N200 Calculus of kidney: Secondary | ICD-10-CM

## 2015-12-26 DIAGNOSIS — Z87442 Personal history of urinary calculi: Secondary | ICD-10-CM | POA: Insufficient documentation

## 2015-12-26 DIAGNOSIS — N201 Calculus of ureter: Secondary | ICD-10-CM | POA: Insufficient documentation

## 2015-12-26 DIAGNOSIS — Z87891 Personal history of nicotine dependence: Secondary | ICD-10-CM | POA: Insufficient documentation

## 2015-12-26 HISTORY — DX: Calculus of ureter: N20.1

## 2015-12-26 HISTORY — DX: Presence of spectacles and contact lenses: Z97.3

## 2015-12-26 HISTORY — DX: Urgency of urination: R39.15

## 2015-12-26 HISTORY — DX: Personal history of other diseases of the respiratory system: Z87.09

## 2015-12-26 HISTORY — DX: Personal history of other diseases of the female genital tract: Z87.42

## 2015-12-26 HISTORY — PX: HOLMIUM LASER APPLICATION: SHX5852

## 2015-12-26 HISTORY — DX: Frequency of micturition: R35.0

## 2015-12-26 HISTORY — PX: CYSTOSCOPY WITH RETROGRADE PYELOGRAM, URETEROSCOPY AND STENT PLACEMENT: SHX5789

## 2015-12-26 HISTORY — DX: Personal history of urinary calculi: Z87.442

## 2015-12-26 HISTORY — DX: Calculus of kidney: N20.0

## 2015-12-26 HISTORY — DX: Gastro-esophageal reflux disease without esophagitis: K21.9

## 2015-12-26 SURGERY — CYSTOURETEROSCOPY, WITH RETROGRADE PYELOGRAM AND STENT INSERTION
Anesthesia: General | Site: Ureter | Laterality: Right

## 2015-12-26 MED ORDER — MIDAZOLAM HCL 2 MG/2ML IJ SOLN
INTRAMUSCULAR | Status: AC
  Filled 2015-12-26: qty 2

## 2015-12-26 MED ORDER — LACTATED RINGERS IV SOLN
INTRAVENOUS | Status: DC
  Administered 2015-12-26 (×2): via INTRAVENOUS
  Filled 2015-12-26: qty 1000

## 2015-12-26 MED ORDER — OXYBUTYNIN CHLORIDE 5 MG PO TABS
5.0000 mg | ORAL_TABLET | Freq: Three times a day (TID) | ORAL | Status: DC
  Administered 2015-12-26: 5 mg via ORAL
  Filled 2015-12-26: qty 1

## 2015-12-26 MED ORDER — OXYCODONE-ACETAMINOPHEN 5-325 MG PO TABS
ORAL_TABLET | ORAL | Status: AC
  Filled 2015-12-26: qty 1

## 2015-12-26 MED ORDER — FENTANYL CITRATE (PF) 100 MCG/2ML IJ SOLN
25.0000 ug | INTRAMUSCULAR | Status: DC | PRN
  Administered 2015-12-26 (×4): 25 ug via INTRAVENOUS
  Filled 2015-12-26: qty 1

## 2015-12-26 MED ORDER — MIDAZOLAM HCL 5 MG/5ML IJ SOLN
INTRAMUSCULAR | Status: DC | PRN
  Administered 2015-12-26: 2 mg via INTRAVENOUS

## 2015-12-26 MED ORDER — OXYBUTYNIN CHLORIDE 5 MG PO TABS: 5.0000 mg | ORAL_TABLET | Freq: Three times a day (TID) | ORAL | Status: DC | PRN

## 2015-12-26 MED ORDER — PROPOFOL 10 MG/ML IV BOLUS
INTRAVENOUS | Status: AC
  Filled 2015-12-26: qty 20

## 2015-12-26 MED ORDER — ONDANSETRON HCL 4 MG/2ML IJ SOLN
INTRAMUSCULAR | Status: DC | PRN
  Administered 2015-12-26: 4 mg via INTRAVENOUS

## 2015-12-26 MED ORDER — LIDOCAINE HCL (CARDIAC) 20 MG/ML IV SOLN
INTRAVENOUS | Status: DC | PRN
  Administered 2015-12-26: 60 mg via INTRAVENOUS

## 2015-12-26 MED ORDER — PROPOFOL 10 MG/ML IV BOLUS
INTRAVENOUS | Status: DC | PRN
  Administered 2015-12-26: 200 mg via INTRAVENOUS
  Administered 2015-12-26: 50 mg via INTRAVENOUS

## 2015-12-26 MED ORDER — AMPICILLIN SODIUM 2 G IJ SOLR
2.0000 g | INTRAMUSCULAR | Status: AC
  Administered 2015-12-26: 2 g via INTRAVENOUS
  Filled 2015-12-26: qty 2000

## 2015-12-26 MED ORDER — ONDANSETRON HCL 4 MG/2ML IJ SOLN
INTRAMUSCULAR | Status: AC
  Filled 2015-12-26: qty 2

## 2015-12-26 MED ORDER — OXYBUTYNIN CHLORIDE 5 MG PO TABS
ORAL_TABLET | ORAL | Status: AC
  Filled 2015-12-26: qty 1

## 2015-12-26 MED ORDER — AMPICILLIN SODIUM 1 G IJ SOLR
INTRAMUSCULAR | Status: AC
  Filled 2015-12-26: qty 1000

## 2015-12-26 MED ORDER — IOPAMIDOL (ISOVUE-370) INJECTION 76%
INTRAVENOUS | Status: DC | PRN
  Administered 2015-12-26: 30 mL

## 2015-12-26 MED ORDER — OXYCODONE-ACETAMINOPHEN 5-325 MG PO TABS: 1.0000 | ORAL_TABLET | ORAL | Status: DC | PRN

## 2015-12-26 MED ORDER — CEPHALEXIN 500 MG PO CAPS: 500.0000 mg | ORAL_CAPSULE | Freq: Three times a day (TID) | ORAL | Status: DC

## 2015-12-26 MED ORDER — FENTANYL CITRATE (PF) 100 MCG/2ML IJ SOLN
INTRAMUSCULAR | Status: AC
  Filled 2015-12-26: qty 2

## 2015-12-26 MED ORDER — KETOROLAC TROMETHAMINE 30 MG/ML IJ SOLN
INTRAMUSCULAR | Status: DC | PRN
  Administered 2015-12-26: 30 mg via INTRAVENOUS

## 2015-12-26 MED ORDER — PROMETHAZINE HCL 25 MG/ML IJ SOLN
6.2500 mg | INTRAMUSCULAR | Status: DC | PRN
  Filled 2015-12-26: qty 1

## 2015-12-26 MED ORDER — SODIUM CHLORIDE 0.9 % IV SOLN
INTRAVENOUS | Status: AC
  Filled 2015-12-26: qty 50

## 2015-12-26 MED ORDER — OXYCODONE-ACETAMINOPHEN 5-325 MG PO TABS
1.0000 | ORAL_TABLET | ORAL | Status: AC | PRN
  Administered 2015-12-26: 1 via ORAL
  Filled 2015-12-26: qty 2

## 2015-12-26 MED ORDER — DEXAMETHASONE SODIUM PHOSPHATE 10 MG/ML IJ SOLN
INTRAMUSCULAR | Status: AC
  Filled 2015-12-26: qty 1

## 2015-12-26 MED ORDER — FENTANYL CITRATE (PF) 100 MCG/2ML IJ SOLN
INTRAMUSCULAR | Status: DC | PRN
  Administered 2015-12-26 (×2): 50 ug via INTRAVENOUS

## 2015-12-26 MED ORDER — DEXTROSE 5 % IV SOLN
1.5000 mg/kg | INTRAVENOUS | Status: DC
  Filled 2015-12-26: qty 2.75

## 2015-12-26 MED ORDER — DEXAMETHASONE SODIUM PHOSPHATE 4 MG/ML IJ SOLN
INTRAMUSCULAR | Status: DC | PRN
  Administered 2015-12-26: 10 mg via INTRAVENOUS

## 2015-12-26 MED ORDER — LIDOCAINE HCL (CARDIAC) 20 MG/ML IV SOLN
INTRAVENOUS | Status: AC
Start: 2015-12-26 — End: 2015-12-26
  Filled 2015-12-26: qty 5

## 2015-12-26 MED ORDER — GENTAMICIN SULFATE 40 MG/ML IJ SOLN
5.0000 mg/kg | INTRAMUSCULAR | Status: AC
  Administered 2015-12-26: 340 mg via INTRAVENOUS
  Filled 2015-12-26: qty 8.5

## 2015-12-26 MED ORDER — KETOROLAC TROMETHAMINE 30 MG/ML IJ SOLN
30.0000 mg | Freq: Once | INTRAMUSCULAR | Status: DC | PRN
  Filled 2015-12-26: qty 1

## 2015-12-26 MED ORDER — KETOROLAC TROMETHAMINE 30 MG/ML IJ SOLN
INTRAMUSCULAR | Status: AC
  Filled 2015-12-26: qty 1

## 2015-12-26 MED ORDER — SODIUM CHLORIDE 0.9 % IR SOLN
Status: DC | PRN
  Administered 2015-12-26: 4000 mL via INTRAVESICAL

## 2015-12-26 SURGICAL SUPPLY — 23 items
BAG DRAIN URO-CYSTO SKYTR STRL (DRAIN) ×2 IMPLANT
BAG DRN UROCATH (DRAIN) ×1
BASKET ZERO TIP NITINOL 2.4FR (BASKET) ×2 IMPLANT
BSKT STON RTRVL ZERO TP 2.4FR (BASKET) ×2
CATH URET 5FR 28IN OPEN ENDED (CATHETERS) ×2 IMPLANT
CATH URET DUAL LUMEN 6-10FR 50 (CATHETERS) IMPLANT
CLOTH BEACON ORANGE TIMEOUT ST (SAFETY) ×2 IMPLANT
FIBER LASER FLEXIVA 365 (UROLOGICAL SUPPLIES) ×1 IMPLANT
FIBER LASER TRAC TIP (UROLOGICAL SUPPLIES) IMPLANT
GLOVE BIO SURGEON STRL SZ7.5 (GLOVE) ×2 IMPLANT
GOWN STRL REUS W/ TWL XL LVL3 (GOWN DISPOSABLE) ×1 IMPLANT
GOWN STRL REUS W/TWL XL LVL3 (GOWN DISPOSABLE) ×2
GUIDEWIRE STR DUAL SENSOR (WIRE) ×2 IMPLANT
IV NS 1000ML (IV SOLUTION) ×2
IV NS 1000ML BAXH (IV SOLUTION) IMPLANT
IV NS IRRIG 3000ML ARTHROMATIC (IV SOLUTION) ×2 IMPLANT
KIT ROOM TURNOVER WOR (KITS) ×2 IMPLANT
MANIFOLD NEPTUNE II (INSTRUMENTS) ×2 IMPLANT
NS IRRIG 500ML POUR BTL (IV SOLUTION) ×1 IMPLANT
PACK CYSTOSCOPY (CUSTOM PROCEDURE TRAY) ×2 IMPLANT
STENT URET 6FRX26 CONTOUR (STENTS) ×1 IMPLANT
SYRINGE IRR TOOMEY STRL 70CC (SYRINGE) ×2 IMPLANT
TUBE CONNECTING 12X1/4 (SUCTIONS) ×2 IMPLANT

## 2015-12-26 NOTE — H&P (View-Only) (Signed)
12:22 PM   Elizabeth Joyce 03/20/1991 [id number]  Referring provider: Dr. Donnetta Hutching  Chief Complaint  Patient presents with  . Flank Pain    HPI: The patient is a 25 year old female who presented to Hospital and was diagnosed with a 12 mm right mid ureteral calculus with severe hydroureteronephrosis proximal. She is an elevated white blood cell count and urinalysis possibly concerning for infection.  She also has a 2 mm left lower pole nonobstructing stone. She has had kidney stones in the past 6 years ago that required intervention.   PMH: Past Medical History  Diagnosis Date  . Kidney stone   . Asthma     Surgical History: Past Surgical History  Procedure Laterality Date  . Appendectomy      Home Medications:    Medication List    ASK your doctor about these medications        cephALEXin 500 MG capsule  Commonly known as:  KEFLEX  Take 1 capsule (500 mg total) by mouth 3 (three) times daily.     ibuprofen 200 MG tablet  Commonly known as:  ADVIL,MOTRIN  Take 200 mg by mouth every 6 (six) hours as needed for pain. For menstrual pain     sulfamethoxazole-trimethoprim 800-160 MG tablet  Commonly known as:  BACTRIM DS,SEPTRA DS  Take 1 tablet by mouth 2 (two) times daily.        Allergies: No Known Allergies  Family History: No family history on file.  Social History:  reports that she has quit smoking. She does not have any smokeless tobacco history on file. She reports that she does not drink alcohol or use illicit drugs.  ROS: 12 point ROS negative except per HPI                                         Physical Exam: BP 117/67 mmHg  Pulse 69  Temp(Src) 98.1 F (36.7 C) (Oral)  Resp 16  Ht  (1.676 m)  Wt 155 lb (70.308 kg)  BMI 25.03 kg/m2  SpO2 95%  LMP 11/07/2015  Constitutional:  Alert and oriented, No acute distress. HEENT: [place] AT, moist mucus membranes.  Trachea midline, no masses. Cardiovascular: No clubbing,  cyanosis, or edema. Respiratory: Normal respiratory effort, no increased work of breathing. GI: Abdomen is soft, nontender, nondistended, no abdominal masses GU: Right CVA tenderness to palpation Skin: No rashes, bruises or suspicious lesions. Lymph: No cervical or inguinal adenopathy. Neurologic: Grossly intact, no focal deficits, moving all 4 extremities. Psychiatric: Normal mood and affect.  Laboratory Data: Lab Results  Component Value Date   WBC 13.6* 12/08/2015   HGB 12.8 12/08/2015   HCT 36.4 12/08/2015   MCV 85.6 12/08/2015   PLT 307 12/08/2015    Lab Results  Component Value Date   CREATININE 0.76 12/08/2015    No results found for: PSA  No results found for: TESTOSTERONE  No results found for: HGBA1C  Urinalysis    Component Value Date/Time   COLORURINE YELLOW 12/08/2015 0910   APPEARANCEUR CLOUDY* 12/08/2015 0910   LABSPEC 1.014 12/08/2015 0910   PHURINE 7.0 12/08/2015 0910   GLUCOSEU NEGATIVE 12/08/2015 0910   HGBUR TRACE* 12/08/2015 0910   BILIRUBINUR NEGATIVE 12/08/2015 0910   KETONESUR NEGATIVE 12/08/2015 0910   PROTEINUR NEGATIVE 12/08/2015 0910   UROBILINOGEN 1.0 01/12/2010 1617   NITRITE NEGATIVE 12/08/2015 0910   LEUKOCYTESUR  LARGE* 12/08/2015 0910    Pertinent Imaging: CLINICAL DATA: 25 year old female with right flank and lumbar back pain for 2 weeks. Personal history of nephrolithiasis. Initial encounter.  EXAM: CT ABDOMEN AND PELVIS WITHOUT CONTRAST  TECHNIQUE: Multidetector CT imaging of the abdomen and pelvis was performed following the standard protocol without IV contrast.  COMPARISON: Noncontrast CT Abdomen and Pelvis 01/12/2010.  FINDINGS: Negative lung bases. No pericardial or pleural effusion.  No acute osseous abnormality identified. Probable benign hemangioma right sacral ala. No other osseous abnormality identified.  Small volume pelvic free fluid in the cul-de-sac and on the left. Negative noncontrast  appearance of the uterus and adnexa, probable small physiologic ovarian cyst on the right. Decompressed distal colon.  Negative sigmoid and left colon. Negative transverse colon. Negative right colon. Sequelae of appendectomy. Negative terminal ileum. No dilated small bowel. Decompressed stomach and duodenum.  No abdominal free air or free fluid. Negative noncontrast liver, gallbladder, spleen, pancreas and adrenal glands.  2-3 mm nonobstructing left lower pole renal calculus. Otherwise negative noncontrast appearance of the left kidney and left ureter. Decompressed urinary bladder.  Severe right nephro megaly and hydronephrosis. Moderate to severe right perinephric inflammatory stranding. Mild regional mass effect due to the renal enlargement. Within the right renal lower pole there is a punctate calculus. Dilated right renal pelvis and ureter to the level of a large oval 7 x 7 x 12 mm obstructing calculus at the S1-S2 level, 11 cm distal to the right ureteral pelvic junction. Distal to this calculus the right ureter is decompressed and difficult to delineate. No other urologic calculus.  IMPRESSION: 1. Severe obstructive uropathy on the right with nephromegaly and perinephric inflammation. Bulky 7 x 7 x 12 mm right ureteral calculus at the S1-S2 level, 11 cm distal to the right UPJ. 2. Small lower pole nonobstructing calculi. 3. Otherwise negative noncontrast CT Abdomen and Pelvis.  Assessment & Plan:    I discussed with the patient that there is concern that his stone is in the setting of a urinary tract infection. I discussed the recommendation for cystoscopy, right retrograde pyelogram, right ureteral stent placement to prevent sepsis. Shee understands that we will not be able to remove the stone today as that could make the infection worse. She understands the risks, benefits, and indications of this procedure and wishes to proceed.   1. Right ureteral calculus 2.  Possible UTI 3. Left lower pole non-obstructing 2 mm calculus -cystoscopy, right retrograde pyelogram, right ureteral stent placement   Hildred LaserBrian James Rex Oesterle, MD

## 2015-12-26 NOTE — Op Note (Signed)
Date of procedure: 12/26/2015  Preoperative diagnosis:  1. Right ureteral stone   Postoperative diagnosis:  1. Right ureteral stone   Procedure: 1. Cystoscopy 2. Right ureteroscopy 3. Laser lithotripsy 4. Stone basketing 5. Right retrograde pyelogram with interpretation 6. Right ureteral stent exchange 6 [place] by 26 cm  Surgeon: Baruch Gouty, MD  Anesthesia: General  Complications: None  Intraoperative findings: The patient's large ureteral stone was visualized in the proximal ureter. It was broken into small pieces and removed the stone basket. Pain ureteroscopy showed no significant stone burden remaining. A right retrograde pyelogram at the end of the procedure showed no filling defects in the renal pelvis consistent with residual stone burden.  EBL: None  Specimens: Right ureteral stone office lab  Drains: 6 French by 26 cm right double-J ureteral stent  Disposition: Stable to the postanesthesia care unit  Indication for procedure: The patient is a 25 y.o. female with a 1-1.5 cm right ureteral calculus status post right ureteral stent in the setting of sepsis. She returns today for definitive stone management.  After reviewing the management options for treatment, the patient elected to proceed with the above surgical procedure(s). We have discussed the potential benefits and risks of the procedure, side effects of the proposed treatment, the likelihood of the patient achieving the goals of the procedure, and any potential problems that might occur during the procedure or recuperation. Informed consent has been obtained.  Description of procedure: The patient was met in the preoperative area. All risks, benefits, and indications of the procedure were described in great detail. The patient consented to the procedure. Preoperative antibiotics were given. The patient was taken to the operative theater. General anesthesia was induced per the anesthesia service. The patient was  then placed in the dorsal lithotomy position and prepped and draped in the usual sterile fashion. A preoperative timeout was called.   A 21 French 30 cystoscope was inserted in the patient's bladder per urethra atraumatically. The right ureteral stent was grasped and brought to level the urethral meatus. A sensor wire was exchanged surgical level of the renal pelvis and the stent was removed. Semirigid ureteroscope was assembled through the patient's right ureter. The stone was visualized in the proximal ureter and broken into small fragments laser lithotripsy. The stones were then removed individually. Pan ureteroscopy was unremarkable for residual stone burden. A right retrograde pyelogram showed no filling defects in the renal pelvis. Due to the large stone burden manipulation decision was made to leave a right ureteral stent without string. The cystoscope was assembled over the sensor wire 6 French by 26 cm double-J ureteral stent was placed and the sensor wire removed. This confirmed to be in the correct location to curl seen in the patient's right renal pelvis fluoroscopy curl seen in the patient's urinary bladder direct visualization. The patient's bladder was then drained and she is woke from anesthesia and transferred stable condition to postanesthesia care unit.  Plan: The patient will follow-up in one week for cystoscopy and stent removal. She will then undergo renal ultrasound 1 month to ensure that her kidney is draining appropriately.  Baruch Gouty, M.D.

## 2015-12-26 NOTE — Anesthesia Procedure Notes (Signed)
Procedure Name: LMA Insertion Date/Time: 12/26/2015 12:04 PM Performed by: Renella CunasHAZEL, Evalee Gerard D Pre-anesthesia Checklist: Patient identified, Emergency Drugs available, Suction available and Patient being monitored Patient Re-evaluated:Patient Re-evaluated prior to inductionOxygen Delivery Method: Circle System Utilized Preoxygenation: Pre-oxygenation with 100% oxygen Intubation Type: IV induction Ventilation: Mask ventilation without difficulty LMA: LMA inserted LMA Size: 4.0 Number of attempts: 1 Airway Equipment and Method: Bite block Placement Confirmation: positive ETCO2 Tube secured with: Tape Dental Injury: Teeth and Oropharynx as per pre-operative assessment

## 2015-12-26 NOTE — Anesthesia Preprocedure Evaluation (Signed)
Anesthesia Evaluation  Patient identified by MRN, date of birth, ID band Patient awake    Reviewed: Allergy & Precautions, NPO status , Patient's Chart, lab work & pertinent test results  Airway Mallampati: I  TM Distance: >3 FB Neck ROM: Full    Dental  (+) Teeth Intact, Dental Advisory Given   Pulmonary asthma , Current Smoker, former smoker,    Pulmonary exam normal breath sounds clear to auscultation       Cardiovascular Exercise Tolerance: Good negative cardio ROS Normal cardiovascular exam Rhythm:Regular Rate:Normal     Neuro/Psych negative neurological ROS  negative psych ROS   GI/Hepatic negative GI ROS, Neg liver ROS,   Endo/Other  negative endocrine ROS  Renal/GU Renal disease     Musculoskeletal negative musculoskeletal ROS (+)   Abdominal   Peds  Hematology negative hematology ROS (+)   Anesthesia Other Findings Day of surgery medications reviewed with the patient.  Reproductive/Obstetrics negative OB ROS                             Lab Results  Component Value Date   WBC 13.6* 12/08/2015   HGB 12.8 12/08/2015   HCT 36.4 12/08/2015   MCV 85.6 12/08/2015   PLT 307 12/08/2015   Lab Results  Component Value Date   CREATININE 0.76 12/08/2015   BUN 9 12/08/2015   NA 138 12/08/2015   K 4.4 12/08/2015   CL 108 12/08/2015   CO2 19* 12/08/2015    Anesthesia Physical  Anesthesia Plan  ASA: II  Anesthesia Plan: General   Post-op Pain Management:    Induction: Intravenous  Airway Management Planned: LMA  Additional Equipment:   Intra-op Plan:   Post-operative Plan: Extubation in OR  Informed Consent: I have reviewed the patients History and Physical, chart, labs and discussed the procedure including the risks, benefits and alternatives for the proposed anesthesia with the patient or authorized representative who has indicated his/her understanding and  acceptance.   Dental advisory given  Plan Discussed with: CRNA  Anesthesia Plan Comments:         Anesthesia Quick Evaluation

## 2015-12-26 NOTE — Interval H&P Note (Signed)
History and Physical Interval Note:  12/26/2015 11:42 AM  Elizabeth Joyce  has presented today for surgery, with the diagnosis of RIGHT URETERAL STONE  The various methods of treatment have been discussed with the patient and family. After consideration of risks, benefits and other options for treatment, the patient has consented to  Procedure(s): CYSTOSCOPY WITH RIGHT RETROGRADE PYELOGRAM, URETEROSCOPY AND RIGHT URETERAL STENT EXCHANGE (Right) HOLMIUM LASER LITHOTRIPSY (Right) as a surgical intervention .  The patient's history has been reviewed, patient examined, no change in status, stable for surgery.  I have reviewed the patient's chart and labs.  Questions were answered to the patient's satisfaction.    Patient presents for definitive stone management.   Hildred LaserBrian James Shaft Corigliano

## 2015-12-26 NOTE — Discharge Instructions (Signed)

## 2015-12-26 NOTE — Transfer of Care (Signed)
Immediate Anesthesia Transfer of Care Note  Patient: Elizabeth Joyce  Procedure(s) Performed: Procedure(s) (LRB): CYSTOSCOPY WITH RIGHT RETROGRADE PYELOGRAM, URETEROSCOPY AND RIGHT URETERAL STENT EXCHANGE (Right) HOLMIUM LASER LITHOTRIPSY (Right)  Patient Location: PACU  Anesthesia Type: General  Level of Consciousness: awake, oriented, sedated and patient cooperative  Airway & Oxygen Therapy: Patient Spontanous Breathing and Patient connected to face mask oxygen  Post-op Assessment: Report given to PACU RN and Post -op Vital signs reviewed and stable  Post vital signs: Reviewed and stable  Complications: No apparent anesthesia complications  Last Vitals:  Filed Vitals:   12/26/15 1059 12/26/15 1254  BP: 118/66 122/78  Pulse: 72 90  Temp: 37 C 36.7 C  Resp: 16 24

## 2015-12-27 ENCOUNTER — Encounter (HOSPITAL_BASED_OUTPATIENT_CLINIC_OR_DEPARTMENT_OTHER): Payer: Self-pay | Admitting: Urology

## 2016-01-01 NOTE — Anesthesia Postprocedure Evaluation (Signed)
Anesthesia Post Note  Patient: Elizabeth Joyce  Procedure(s) Performed: Procedure(s) (LRB): CYSTOSCOPY WITH RIGHT RETROGRADE PYELOGRAM, URETEROSCOPY AND RIGHT URETERAL STENT EXCHANGE (Right) HOLMIUM LASER LITHOTRIPSY (Right)  Patient location during evaluation: PACU Anesthesia Type: General Level of consciousness: awake and alert Pain management: pain level controlled Vital Signs Assessment: post-procedure vital signs reviewed and stable Respiratory status: spontaneous breathing, nonlabored ventilation and respiratory function stable Cardiovascular status: blood pressure returned to baseline and stable Postop Assessment: no signs of nausea or vomiting Anesthetic complications: no    Last Vitals:  Filed Vitals:   12/26/15 1400 12/26/15 1446  BP: 107/67 114/65  Pulse: 59 73  Temp:  36.8 C  Resp: 15 14    Last Pain:  Filed Vitals:   12/27/15 1433  PainSc: 5                  Kennieth RadFitzgerald, Khylie Larmore E

## 2016-03-19 ENCOUNTER — Emergency Department (HOSPITAL_COMMUNITY)
Admission: EM | Admit: 2016-03-19 | Discharge: 2016-03-19 | Disposition: A | Discharge status: Home / Self Care | Payer: Self-pay | Attending: Emergency Medicine | Admitting: Emergency Medicine

## 2016-03-19 ENCOUNTER — Encounter (HOSPITAL_COMMUNITY): Payer: Self-pay

## 2016-03-19 DIAGNOSIS — F17210 Nicotine dependence, cigarettes, uncomplicated: Secondary | ICD-10-CM | POA: Insufficient documentation

## 2016-03-19 DIAGNOSIS — J45909 Unspecified asthma, uncomplicated: Secondary | ICD-10-CM | POA: Insufficient documentation

## 2016-03-19 DIAGNOSIS — L559 Sunburn, unspecified: Secondary | ICD-10-CM | POA: Insufficient documentation

## 2016-03-19 MED ORDER — MUPIROCIN CALCIUM 2 % EX CREA: 1.0000 "application " | TOPICAL_CREAM | Freq: Two times a day (BID) | CUTANEOUS | Status: DC

## 2016-03-19 MED ORDER — NAPROXEN 500 MG PO TABS: 500.0000 mg | ORAL_TABLET | Freq: Two times a day (BID) | ORAL | Status: DC

## 2016-03-19 NOTE — ED Provider Notes (Signed)
CSN: [id number]     Arrival date & time 03/19/16  1634 History  By signing my name below, I, Placido SouLogan Joldersma, attest that this documentation has been prepared under the direction and in the presence of Tore Carreker, PA-C.  Electronically Signed: Placido SouLogan Joldersma, ED Scribe. 03/19/2016. 5:11 PM.    Chief Complaint  Patient presents with  . Sunburn    x1 WEEK AGO   The history is provided by the patient. No language interpreter was used.    HPI Comments: Elizabeth Joyce is a 25 y.o. female who presents to the Emergency Department complaining of moderate sunburn across her chest and abd x 1 week. [place]. Pt reports moderate pain across the affected regions and a single blister below her right breast. She also notes that her skin is peeling across her chest. Her pain worsens with any palpation of the region. She applied Solarcaine which she states worked initially and then provided no relief. Pt has additionally applied vitamin E oil without relief. She has not taken any PO medications. She denies purulent drainage, fevers, chills, nausea, vomiting or any other associated symptoms at this time.   Past Medical History  Diagnosis Date  . Right ureteral stone   . Nephrolithiasis     left  non-obstucitve per ct 12-08-2015  . History of kidney stones   . History of ovarian cyst   . Urgency of urination   . Frequency of urination   . Personal history of asthma     as child--  exercise induced  . Wears glasses   . GERD (gastroesophageal reflux disease)     occasional-- takes mustard or milk   Past Surgical History  Procedure Laterality Date  . Cystoscopy w/ ureteral stent placement Right 12/08/2015    Procedure: CYSTOSCOPY WITH RETROGRADE PYELOGRAM/ RIGHT URETERAL STENT PLACEMENT;  Surgeon: Hildred LaserBrian James Budzyn, MD;  Location: [facility];  Service: Urology;  Laterality: Right;  . Laparoscopic appendectomy  05-23-2009  . Left ureteroscopic laser  lithotrispy stone extraction/  stent placement  01-17-2010  . Surgery repair head, neck and left leg from dog bites  1996  (age 89)  . Cystoscopy with retrograde pyelogram, ureteroscopy and stent placement Right 12/26/2015    Procedure: CYSTOSCOPY WITH RIGHT RETROGRADE PYELOGRAM, URETEROSCOPY AND RIGHT URETERAL STENT EXCHANGE;  Surgeon: Hildred LaserBrian James Budzyn, MD;  [facility] [place] [facility];  Service: Urology;  Laterality: Right;  . Holmium laser application Right 12/26/2015    Procedure: HOLMIUM LASER LITHOTRIPSY;  Surgeon: Hildred LaserBrian James Budzyn, MD;  [facility] [place];  Service: Urology;  Laterality: Right;   History reviewed. No pertinent family history. Social History  Substance Use Topics  . Smoking status: Current Some Day Smoker -- 2 years    Types: Cigarettes  . Smokeless tobacco: Never Used  . Alcohol Use: No   OB History    No data available     Review of Systems  Constitutional: Negative for fever and chills.  Skin: Positive for color change.  All other systems reviewed and are negative.   Allergies  Review of patient's allergies indicates no known allergies.  Home Medications   Prior to Admission medications   Medication Sig Start Date End Date Taking? Authorizing Provider  cephALEXin (KEFLEX) 500 MG capsule Take 1 capsule (500 mg total) by mouth 3 (three) times daily. 12/26/15   Hildred LaserBrian James Budzyn, MD  diphenhydrAMINE (BENADRYL) 25 MG tablet Take 25 mg  by mouth every 6 (six) hours as needed.    Historical Provider, MD  ibuprofen (ADVIL,MOTRIN) 200 MG tablet Take 200 mg by mouth every 6 (six) hours as needed for pain. For menstrual pain    Historical Provider, MD  mupirocin cream (BACTROBAN) 2 % Apply 1 application topically 2 (two) times daily. 03/19/16   Makenley Shimp, PA-C  naproxen (NAPROSYN) 500 MG tablet Take 1 tablet (500 mg total) by mouth 2 (two) times daily. 03/19/16   Takyia Sindt, PA-C  oxybutynin (DITROPAN) 5 MG tablet Take 1 tablet (5  mg total) by mouth 3 (three) times daily as needed for bladder spasms. 12/26/15   Hildred Laser, MD  oxyCODONE-acetaminophen (ROXICET) 5-325 MG tablet Take 1 tablet by mouth every 4 (four) hours as needed for severe pain. 12/26/15   Hildred Laser, MD   Pulse 72  Temp(Src) 97.9 F (36.6 C) (Oral)  Resp 13  Ht  (1.676 m)  Wt 68.04 kg  BMI 24.22 kg/m2  SpO2 99%  LMP 03/19/2016 Physical Exam  Constitutional: She appears well-developed and well-nourished. No distress.  Nontoxic appearing  HENT:  Head: Normocephalic and atraumatic.  Right Ear: External ear normal.  Left Ear: External ear normal.  Eyes: Conjunctivae are normal. Right eye exhibits no discharge. Left eye exhibits no discharge. No scleral icterus.  Neck: Normal range of motion.  Cardiovascular: Normal rate.   Pulmonary/Chest: Effort normal.  Musculoskeletal: Normal range of motion.  Moves all extremities spontaneously  Neurological: She is alert. Coordination normal.  Skin: Skin is warm and dry. There is erythema.     Erythematous skin over the anterior torso superior and inferior to the breasts. Some skin peeling noted above left breast. Single 1 cm blister with serous fluid noted below right breast. Blister intact. Skin is TTP without sloughing. No vesicles or pustules. No sign of superficial skin infection.   Psychiatric: She has a normal mood and affect. Her behavior is normal.  Nursing note and vitals reviewed.   ED Course  Procedures  DIAGNOSTIC STUDIES: Oxygen Saturation is 99% on RA, normal by my interpretation.    COORDINATION OF CARE: 5:09 PM Discussed next steps with pt. Pt verbalized understanding and is agreeable with the plan.   Labs Review Labs Reviewed - No data to display  Imaging Review No results found.   EKG Interpretation None      MDM   Final diagnoses:  Sunburn   25 year old female presenting with sunburn to the anterior torso after falling asleep outside. Afebrile.  Erythematous skin consistent with a sunburn noted to the anterior chest. Single intact blister noted to the right breast. Some skin peeling has begun above the left breast. No signs of superficial skin infection. Discussed using topical cooling ointments such as aloe vera and calamine. Discussed using anti-inflammatories such as Motrin. Will give Bactroban to apply to any blisters that subsequently burst. Return precautions given in discharge paperwork and discussed with pt at bedside. Pt stable for discharge  I personally performed the services described in this documentation, which was scribed in my presence. The recorded information has been reviewed and is accurate.   Alveta Heimlich, PA-C 03/19/16 1729  Maia Plan, MD 03/20/16 [phone]

## 2016-03-19 NOTE — ED Notes (Signed)
PT C/O A SUNBURN TO THE MID CHEST AND ACROSS THE ABDOMEN X1 WEEK AGO. PT STATES SHE FELL ASLEEP OUTSIDE.

## 2016-03-19 NOTE — ED Notes (Signed)
PT DISCHARGED. INSTRUCTIONS AND PRESCRIPTIONS GIVEN. AAOX4. PT IN NO APPARENT DISTRESS. THE OPPORTUNITY TO ASK QUESTIONS WAS PROVIDED. 

## 2016-03-19 NOTE — Discharge Instructions (Signed)
Sunburn °Sunburn is damage to the skin caused by overexposure to ultraviolet (UV) rays. People with light skin or a fair complexion may be more susceptible to sunburn. Repeated sun exposure causes early skin aging such as wrinkles and sun spots. It also increases the risk of skin cancer. °CAUSES °A sunburn is caused by getting too much UV radiation from the sun. °SYMPTOMS °· Red or pink skin. °· Soreness and swelling. °· Pain. °· Blisters. °· Peeling skin. °· Headache, fever, and fatigue if sunburn covers a large area. °TREATMENT °· Your caregiver may tell you to take certain medicines to lessen inflammation. °· Your caregiver may have you use hydrocortisone cream or spray to help with itching and inflammation. °· Your caregiver may prescribe an antibiotic cream to use on blisters. °HOME CARE INSTRUCTIONS  °· Avoid further exposure to the sun. °· Cool baths and cool compresses may be helpful if used several times per day. Do not apply ice, since this may result in more damage to the skin. °· Only take over-the-counter or prescription medicines for pain, discomfort, or fever as directed by your caregiver. °· Use aloe or other over-the-counter sunburn creams or gels on your skin. Do not apply these creams or gels on blisters. °· Drink enough fluids to keep your urine clear or pale yellow. °· Do not break blisters. If blisters break, your caregiver may recommend an antibiotic cream to apply to the affected area. °PREVENTION  °· Try to avoid the sun between 10:00 a.m. and 4:00 p.m. when it is the strongest. °· Apply sunscreen at least 30 minutes before exposure to the sun. °· Always wear protective hats, clothing, and sunglasses with UV protection. °· Avoid medicines, herbs, and foods that increase your sensitivity to sunlight. °· Avoid tanning beds. °SEEK IMMEDIATE MEDICAL CARE IF:  °· You have a fever. °· Your pain is uncontrolled with medicine. °· You start to vomit or have diarrhea. °· You feel faint or develop a  headache with confusion. °· You develop severe blistering. °· You have a pus-like (purulent) discharge coming from the blisters.  °· Your burn becomes more painful and swollen. °MAKE SURE YOU: °· Understand these instructions. °· Will watch your condition. °· Will get help right away if you are not doing well or get worse. °  °This information is not intended to replace advice given to you by your health care provider. Make sure you discuss any questions you have with your health care provider. °  °Document Released: 06/05/2005 Document Revised: 12/21/2012 Document Reviewed: 02/27/2015 °Elsevier Interactive Patient Education ©2016 Elsevier Inc. ° °

## 2016-03-19 NOTE — Progress Notes (Signed)
CM spoke with pt who confirms uninsured [place] resident with no pcp.  CM discussed and provided written information to assist pt with determining choice for uninsured accepting pcps, discussed the importance of pcp vs EDP services for f/u care, www.needymeds.org, www.goodrx.com, discounted pharmacies and other [place] resources such as [facility] , [facility], affordable care act, financial assistance, uninsured dental services, [place] med assist, DSS and  health department  Reviewed resources for [place] uninsured accepting pcps like [facility], [facility] [address], [facility] [place], [facility], local urgent care centers, [facility], [facility], general medical clinics, [facility], [facility] plus others, medication resources, [facility] out patient [facility]ies and housing Pt voiced understanding and appreciation of resources provided   Provided [facility] contact information 

## 2016-05-30 ENCOUNTER — Encounter (HOSPITAL_COMMUNITY): Payer: Self-pay | Admitting: *Deleted

## 2016-05-30 DIAGNOSIS — R112 Nausea with vomiting, unspecified: Secondary | ICD-10-CM | POA: Insufficient documentation

## 2016-05-30 DIAGNOSIS — J45901 Unspecified asthma with (acute) exacerbation: Secondary | ICD-10-CM | POA: Insufficient documentation

## 2016-05-30 DIAGNOSIS — R197 Diarrhea, unspecified: Secondary | ICD-10-CM | POA: Insufficient documentation

## 2016-05-30 DIAGNOSIS — J069 Acute upper respiratory infection, unspecified: Secondary | ICD-10-CM | POA: Insufficient documentation

## 2016-05-30 DIAGNOSIS — F17210 Nicotine dependence, cigarettes, uncomplicated: Secondary | ICD-10-CM | POA: Insufficient documentation

## 2016-05-30 LAB — COMPREHENSIVE METABOLIC PANEL
ALT: 19 U/L (ref 14–54)
ANION GAP: 10 (ref 5–15)
AST: 24 U/L (ref 15–41)
Albumin: 4.6 g/dL (ref 3.5–5.0)
Alkaline Phosphatase: 78 U/L (ref 38–126)
BILIRUBIN TOTAL: 1.2 mg/dL (ref 0.3–1.2)
BUN: 8 mg/dL (ref 6–20)
CO2: 21 mmol/L — ABNORMAL LOW (ref 22–32)
Calcium: 10 mg/dL (ref 8.9–10.3)
Chloride: 107 mmol/L (ref 101–111)
Creatinine, Ser: 0.73 mg/dL (ref 0.44–1.00)
Glucose, Bld: 123 mg/dL — ABNORMAL HIGH (ref 65–99)
Potassium: 3.5 mmol/L (ref 3.5–5.1)
Sodium: 138 mmol/L (ref 135–145)
TOTAL PROTEIN: 8.2 g/dL — AB (ref 6.5–8.1)

## 2016-05-30 LAB — CBC
HEMATOCRIT: 46.9 % — AB (ref 36.0–46.0)
HEMOGLOBIN: 16.4 g/dL — AB (ref 12.0–15.0)
MCH: 31.3 pg (ref 26.0–34.0)
MCHC: 35 g/dL (ref 30.0–36.0)
MCV: 89.5 fL (ref 78.0–100.0)
Platelets: 315 10*3/uL (ref 150–400)
RBC: 5.24 MIL/uL — AB (ref 3.87–5.11)
RDW: 13.3 % (ref 11.5–15.5)
WBC: 23.3 10*3/uL — AB (ref 4.0–10.5)

## 2016-05-30 MED ORDER — ONDANSETRON 4 MG PO TBDP
4.0000 mg | ORAL_TABLET | Freq: Once | ORAL | Status: AC | PRN
  Administered 2016-05-30: 4 mg via ORAL

## 2016-05-30 MED ORDER — ONDANSETRON 4 MG PO TBDP
ORAL_TABLET | ORAL | Status: AC
  Filled 2016-05-30: qty 1

## 2016-05-30 NOTE — ED Triage Notes (Signed)
Pt c/o NVD today. Reports having congestion yesterday that seemed to subside. Has not been able to tolerate any PO intake at home

## 2016-05-30 NOTE — ED Notes (Signed)
Attempted to give zofran ODT, pt vomiting in trash can

## 2016-05-31 ENCOUNTER — Emergency Department (HOSPITAL_COMMUNITY): Payer: Self-pay

## 2016-05-31 ENCOUNTER — Emergency Department (HOSPITAL_COMMUNITY)
Admission: EM | Admit: 2016-05-31 | Discharge: 2016-05-31 | Disposition: A | Discharge status: Home / Self Care | Payer: Self-pay | Attending: Emergency Medicine | Admitting: Emergency Medicine

## 2016-05-31 DIAGNOSIS — R112 Nausea with vomiting, unspecified: Secondary | ICD-10-CM

## 2016-05-31 DIAGNOSIS — J069 Acute upper respiratory infection, unspecified: Secondary | ICD-10-CM

## 2016-05-31 DIAGNOSIS — J45901 Unspecified asthma with (acute) exacerbation: Secondary | ICD-10-CM

## 2016-05-31 DIAGNOSIS — R197 Diarrhea, unspecified: Secondary | ICD-10-CM

## 2016-05-31 LAB — URINE MICROSCOPIC-ADD ON: RBC / HPF: NONE SEEN RBC/hpf (ref 0–5)

## 2016-05-31 LAB — URINALYSIS, ROUTINE W REFLEX MICROSCOPIC
Bilirubin Urine: NEGATIVE
Glucose, UA: >1000 mg/dL — AB
Hgb urine dipstick: NEGATIVE
Ketones, ur: >80 mg/dL — AB
Leukocytes, UA: NEGATIVE
Nitrite: NEGATIVE
Protein, ur: 30 mg/dL — AB
Specific Gravity, Urine: 1.031 — ABNORMAL HIGH (ref 1.005–1.030)
pH: 5.5 (ref 5.0–8.0)

## 2016-05-31 LAB — I-STAT BETA HCG BLOOD, ED (MC, WL, AP ONLY): I-stat hCG, quantitative: <5 m[IU]/mL (ref <5)

## 2016-05-31 LAB — LIPASE, BLOOD: Lipase: 18 U/L (ref 11–51)

## 2016-05-31 MED ORDER — SODIUM CHLORIDE 0.9 % IV BOLUS (SEPSIS)
1000.0000 mL | Freq: Once | INTRAVENOUS | Status: AC
  Administered 2016-05-31: 1000 mL via INTRAVENOUS

## 2016-05-31 MED ORDER — ALBUTEROL (5 MG/ML) CONTINUOUS INHALATION SOLN
15.0000 mg/h | INHALATION_SOLUTION | Freq: Once | RESPIRATORY_TRACT | Status: AC
  Administered 2016-05-31: 15 mg/h via RESPIRATORY_TRACT
  Filled 2016-05-31: qty 20

## 2016-05-31 MED ORDER — KETOROLAC TROMETHAMINE 30 MG/ML IJ SOLN
30.0000 mg | Freq: Once | INTRAMUSCULAR | Status: AC
  Administered 2016-05-31: 30 mg via INTRAVENOUS
  Filled 2016-05-31: qty 1

## 2016-05-31 MED ORDER — PROMETHAZINE HCL 25 MG PO TABS
25.0000 mg | ORAL_TABLET | Freq: Four times a day (QID) | ORAL | 0 refills | Status: AC | PRN
Start: 2016-05-31 — End: ?

## 2016-05-31 MED ORDER — ONDANSETRON HCL 4 MG/2ML IJ SOLN
4.0000 mg | Freq: Once | INTRAMUSCULAR | Status: AC
  Administered 2016-05-31: 4 mg via INTRAVENOUS
  Filled 2016-05-31: qty 2

## 2016-05-31 MED ORDER — ALBUTEROL SULFATE HFA 108 (90 BASE) MCG/ACT IN AERS
2.0000 | INHALATION_SPRAY | Freq: Once | RESPIRATORY_TRACT | Status: AC
  Administered 2016-05-31: 2 via RESPIRATORY_TRACT
  Filled 2016-05-31: qty 6.7

## 2016-05-31 MED ORDER — GUAIFENESIN-CODEINE 100-10 MG/5ML PO SOLN
10.0000 mL | Freq: Once | ORAL | Status: AC
  Administered 2016-05-31: 10 mL via ORAL
  Filled 2016-05-31: qty 10

## 2016-05-31 MED ORDER — LOPERAMIDE HCL 2 MG PO CAPS
4.0000 mg | ORAL_CAPSULE | Freq: Once | ORAL | Status: AC
  Administered 2016-05-31: 4 mg via ORAL
  Filled 2016-05-31: qty 2

## 2016-05-31 MED ORDER — LOPERAMIDE HCL 2 MG PO CAPS: 2.0000 mg | ORAL_CAPSULE | Freq: Four times a day (QID) | ORAL | 0 refills | Status: DC | PRN

## 2016-05-31 MED ORDER — PREDNISONE 20 MG PO TABS: 60.0000 mg | ORAL_TABLET | Freq: Every day | ORAL | 0 refills | Status: DC

## 2016-05-31 MED ORDER — IPRATROPIUM BROMIDE 0.02 % IN SOLN
0.5000 mg | Freq: Once | RESPIRATORY_TRACT | Status: AC
  Administered 2016-05-31: 0.5 mg via RESPIRATORY_TRACT
  Filled 2016-05-31: qty 2.5

## 2016-05-31 MED ORDER — GUAIFENESIN-CODEINE 100-10 MG/5ML PO SOLN
5.0000 mL | Freq: Four times a day (QID) | ORAL | 0 refills | Status: DC | PRN
Start: 2016-05-31 — End: 2024-02-05

## 2016-05-31 MED ORDER — METHYLPREDNISOLONE SODIUM SUCC 125 MG IJ SOLR
125.0000 mg | Freq: Once | INTRAMUSCULAR | Status: AC
  Administered 2016-05-31: 125 mg via INTRAVENOUS
  Filled 2016-05-31: qty 2

## 2016-05-31 NOTE — Discharge Instructions (Signed)
To find a primary care or specialty doctor please call [phone] or [phone] to access "[facility] Find a Doctor Service." ° °You may also go on the [facility] website at [email or web address] ° °There are also multiple [facility] practices throughout the [place] that are frequently accepting new patients. You may find a clinic that is close to your home and contact them. ° ° and Wellness -  °[address] °[place] [address] °[phone] ° °[facility] in [place] (also locations in [place] and [place]) -  °[address] °[place] [address] °[phone] ° °[place] [facility] -  °[address] °[place] [address] °[phone] ° ° °

## 2016-05-31 NOTE — ED Notes (Signed)
Pt reminded of need for urine.  Denies need to urinate at this time.

## 2016-05-31 NOTE — ED Notes (Signed)
Pt transported to xray 

## 2016-05-31 NOTE — ED Provider Notes (Signed)
By signing my name below, I, Elizabeth Joyce, attest that this documentation has been prepared under the direction and in the presence of Elizabeth Josten N Claude Waldman, DO. Electronically Signed: Rosario Joyce, ED Scribe. 05/31/16. 1:18 AM.  TIME SEEN: 1:18 AM  CHIEF COMPLAINT:  Chief Complaint  Patient presents with  . Emesis  . Nasal Congestion   HPI Comments: Elizabeth Joyce is a 25 y.o. female with a PMHx of asthma, History of kidney stones and ovarian cysts who presents to the Emergency Department with multiple complaints. Pt reports that she woke up two days ago with chest congestion w/ associated cough and SOB, wheezing.  She additionally states that she woke up this morning with new nausea, vomiting, and diarrhea. Pt reports associated subjective fevers. Pt further reports that she has had a mild HA and back pain from Coughing and vomiting. Pt has been taking Nyquil and Dayquil with minimal relief of any of her symptoms. Pt has not been able to tolerate PO intake at home. No sick contact with similarly symptoms directly; however she states that she does work in [employer]. Pt has had a laparoscopic appendectomy in the past. Denies dysuria, hematuria, vaginal bleeding or discharge, rash, or any other associated symptoms.   ROS: See HPI Constitutional: Subjective fever  Eyes: no drainage  ENT: no runny nose   Cardiovascular:  no chest pain  Resp: SOB  GI: vomiting and diarrhea GU: no dysuria Integumentary: no rash  Allergy: no hives  Musculoskeletal: no leg swelling  Neurological: no slurred speech ROS otherwise negative  PAST MEDICAL HISTORY/PAST SURGICAL HISTORY:  Past Medical History:  Diagnosis Date  . Frequency of urination   . GERD (gastroesophageal reflux disease)    occasional-- takes mustard or milk  . History of kidney stones   . History of ovarian cyst   . Nephrolithiasis    left  non-obstucitve per ct 12-08-2015  . Personal history of asthma    as child--   exercise induced  . Right ureteral stone   . Urgency of urination   . Wears glasses     MEDICATIONS:  Prior to Admission medications   Medication Sig Start Date End Date Taking? Authorizing Provider  cephALEXin (KEFLEX) 500 MG capsule Take 1 capsule (500 mg total) by mouth 3 (three) times daily. 12/26/15   Hildred Laser, MD  diphenhydrAMINE (BENADRYL) 25 MG tablet Take 25 mg by mouth every 6 (six) hours as needed.    Historical Provider, MD  ibuprofen (ADVIL,MOTRIN) 200 MG tablet Take 200 mg by mouth every 6 (six) hours as needed for pain. For menstrual pain    Historical Provider, MD  mupirocin cream (BACTROBAN) 2 % Apply 1 application topically 2 (two) times daily. 03/19/16   Stevi Barrett, PA-C  naproxen (NAPROSYN) 500 MG tablet Take 1 tablet (500 mg total) by mouth 2 (two) times daily. 03/19/16   Stevi Barrett, PA-C  oxybutynin (DITROPAN) 5 MG tablet Take 1 tablet (5 mg total) by mouth 3 (three) times daily as needed for bladder spasms. 12/26/15   Hildred Laser, MD  oxyCODONE-acetaminophen (ROXICET) 5-325 MG tablet Take 1 tablet by mouth every 4 (four) hours as needed for severe pain. 12/26/15   Hildred Laser, MD    ALLERGIES:  No Known Allergies  SOCIAL HISTORY:  Social History  Substance Use Topics  . Smoking status: Current Some Day Smoker    Years: 2.00    Types: Cigarettes  . Smokeless tobacco: Never Used  .  Alcohol use No    FAMILY HISTORY: No family history on file.  EXAM: BP 112/77   Pulse 88   Temp 98.5 F (36.9 C) (Oral)   Resp 18   LMP 05/17/2016   SpO2 96%  CONSTITUTIONAL: Alert and oriented and responds appropriately to questions. Well-appearing; well-nourished HEAD: Normocephalic EYES: Conjunctivae clear, PERRL ENT: normal nose; no rhinorrhea; moist mucous membranes; No pharyngeal erythema or petechiae, no tonsillar hypertrophy or exudate, no uvular deviation, no trismus or drooling, normal phonation, no stridor, no dental caries present, no  drainable dental abscess noted, no Ludwig's angina, tongue sits flat in the bottom of the mouth, no angioedema, no facial erythema or warmth, no facial swelling NECK: Supple, no meningismus, no LAD  CARD: RRR; S1 and S2 appreciated; no murmurs, no clicks, no rubs, no gallops RESP: Normal chest excursion without splinting or tachypnea; breath sounds clear and equal bilaterally but with diffuse expiratory wheezes; no rhonchi, no rales, no hypoxia or respiratory distress, speaking full sentences ABD/GI: Normal bowel sounds; non-distended; soft, non-tender, no rebound, no guarding, no peritoneal signs BACK:  The back appears normal and is non-tender to palpation, there is no CVA tenderness EXT: Normal ROM in all joints; non-tender to palpation; no edema; normal capillary refill; no cyanosis, no calf tenderness or swelling    SKIN: Normal color for age and race; warm; no rash NEURO: Moves all extremities equally, sensation to light touch intact diffusely, cranial nerves II through XII intact PSYCH: The patient's mood and manner are appropriate. Grooming and personal hygiene are appropriate.  MEDICAL DECISION MAKING: Patient here with subjective fevers, cough, vomiting and diarrhea. Abdominal exam is completely benign. Initially patient was febrile and tachycardic but this has improved. She is hematologically stable, nontoxic appearing. Labs have been unremarkable other than a leukocytosis. I do not feel she needs emergent imaging of her abdomen. Doubt cholecystitis, colitis, diverticulitis. We'll obtain a chest x-ray, urinalysis. Will treat symptomatically with IV fluids, Toradol, Zofran, albuterol and Atrovent, Solu-Medrol.  ED PROGRESS: 5:05 AM  Chest x-ray shows no infiltrate. Urine shows no sign of infection. She does have large ketones but has received 2 L of IV fluids. Is now drinking without difficulty and no further vomiting or diarrhea. Lungs are clear to auscultation in which she reports feeling  much better.  We'll discharge with guaifenesin with codeine, albuterol inhaler, prednisone burst. Will also discharge with Phenergan and Imodium. Suspect viral illness. Abdominal exam is still benign. She is tachycardic but this is after receiving 15 mg of albuterol. I feel she is safe for discharge home. She is ready to leave. Discharge with work note. Discussed return precautions. Given outpatient PCP follow-up information.   EKG Interpretation  Date/Time:  Friday May 31 2016 03:04:04 EDT Ventricular Rate:  141 PR Interval:    QRS Duration: 74 QT Interval:  295 QTC Calculation: 452 R Axis:   70 Text Interpretation:  Sinus tachycardia Consider right atrial enlargement Borderline ST depression, diffuse leads likely rate related Baseline wander in lead(s) V3 No old tracing to compare Confirmed by Acen Craun,  DO, Joleene Burnham ([id number]) on 05/31/2016 3:09:34 AM       I personally performed the services described in the above documentation, which was scribed in my presence. The recorded information above has been reviewed and is accurate.      Layla MawKristen N Quanta Roher, DO 05/31/16 [phone]

## 2016-05-31 NOTE — ED Notes (Signed)
Spoke to lab.  Will add Lipase

## 2016-05-31 NOTE — ED Notes (Signed)
Pt able to maintain sips of ginger ale without nausea or vomiting.  Pt sts "my stomach just feels hungry now"

## 2019-10-29 ENCOUNTER — Other Ambulatory Visit: Payer: Self-pay

## 2019-10-29 ENCOUNTER — Emergency Department (HOSPITAL_COMMUNITY): Payer: Self-pay

## 2019-10-29 ENCOUNTER — Encounter (HOSPITAL_COMMUNITY): Payer: Self-pay

## 2019-10-29 ENCOUNTER — Emergency Department (HOSPITAL_COMMUNITY)
Admission: EM | Admit: 2019-10-29 | Discharge: 2019-10-29 | Disposition: A | Discharge status: Home / Self Care | Payer: Self-pay | Attending: Emergency Medicine | Admitting: Emergency Medicine

## 2019-10-29 DIAGNOSIS — Y9351 Activity, roller skating (inline) and skateboarding: Secondary | ICD-10-CM | POA: Insufficient documentation

## 2019-10-29 DIAGNOSIS — S52502A Unspecified fracture of the lower end of left radius, initial encounter for closed fracture: Secondary | ICD-10-CM | POA: Insufficient documentation

## 2019-10-29 DIAGNOSIS — Y929 Unspecified place or not applicable: Secondary | ICD-10-CM | POA: Insufficient documentation

## 2019-10-29 DIAGNOSIS — W19XXXA Unspecified fall, initial encounter: Secondary | ICD-10-CM

## 2019-10-29 DIAGNOSIS — Z79899 Other long term (current) drug therapy: Secondary | ICD-10-CM | POA: Insufficient documentation

## 2019-10-29 DIAGNOSIS — Y999 Unspecified external cause status: Secondary | ICD-10-CM | POA: Insufficient documentation

## 2019-10-29 DIAGNOSIS — Z87891 Personal history of nicotine dependence: Secondary | ICD-10-CM | POA: Insufficient documentation

## 2019-10-29 MED ORDER — OXYCODONE-ACETAMINOPHEN 5-325 MG PO TABS
1.0000 | ORAL_TABLET | Freq: Once | ORAL | Status: AC
  Administered 2019-10-29: 1 via ORAL
  Filled 2019-10-29: qty 1

## 2019-10-29 MED ORDER — ONDANSETRON 4 MG PO TBDP: 4.0000 mg | ORAL_TABLET | Freq: Three times a day (TID) | ORAL | 0 refills | Status: DC | PRN

## 2019-10-29 MED ORDER — OXYCODONE-ACETAMINOPHEN 5-325 MG PO TABS: 1.0000 | ORAL_TABLET | Freq: Four times a day (QID) | ORAL | 0 refills | Status: DC | PRN

## 2019-10-29 MED ORDER — ONDANSETRON 4 MG PO TBDP
4.0000 mg | ORAL_TABLET | Freq: Once | ORAL | Status: AC
  Administered 2019-10-29: 20:00:00 4 mg via ORAL
  Filled 2019-10-29: qty 1

## 2019-10-29 NOTE — ED Triage Notes (Signed)
Patient states she was skating on roller skates and felt hurting her left wrist. Patient was skating on a basketball court. Patient did not hit her head or have LOC.

## 2019-10-29 NOTE — Discharge Instructions (Signed)
Today you received medications that may make you sleepy or impair your ability to make decisions.  For the next 24 hours please do not drive, operate heavy machinery, care for a small child with out another adult present, or perform any activities that may cause harm to you or someone else if you were to fall asleep or be impaired.  ° °You are being prescribed a medication which may make you sleepy. Please follow up of listed precautions for at least 24 hours after taking one dose. ° ° °Please take Ibuprofen (Advil, motrin) and Tylenol (acetaminophen) to relieve your pain.  You may take up to 600 MG (3 pills) of normal strength ibuprofen every 8 hours as needed.  In between doses of ibuprofen you make take tylenol, up to 1,000 mg (two extra strength pills).  Do not take more than 3,000 mg tylenol in a 24 hour period.  Please check all medication labels as many medications such as pain and cold medications may contain tylenol.  Do not drink alcohol while taking these medications.  Do not take other NSAID'S while taking ibuprofen (such as aleve or naproxen).  Please take ibuprofen with food to decrease stomach upset. ° °

## 2019-10-29 NOTE — ED Provider Notes (Signed)
[facility] DEPT Provider Note   CSN: [id number] Arrival date & time: 10/29/19  1759     History Chief Complaint  Patient presents with  . Wrist Injury    Elizabeth Joyce is a 29 y.o. female with past medical history of renal stones who presents today for evaluation of left wrist pain.  She is right-hand dominant.  She was rollerskating prior to arrival when she fell backwards on a hyperextended left wrist. She denies striking her head or passing out.  No concern for other injuries.  She denies any numbness.  She denies any pain in her left elbow or wrist. No other complaints or concerns.  No interventions tried prior to arrival.  It is made worse with movement and touch.  HPI     Past Medical History:  Diagnosis Date  . Frequency of urination   . GERD (gastroesophageal reflux disease)    occasional-- takes mustard or milk  . History of kidney stones   . History of ovarian cyst   . Nephrolithiasis    left  non-obstucitve per ct 12-08-2015  . Personal history of asthma    as child--  exercise induced  . Right ureteral stone   . Urgency of urination   . Wears glasses     Patient Active Problem List   Diagnosis Date Noted  . Right kidney stone 12/08/2015    Past Surgical History:  Procedure Laterality Date  . APPENDECTOMY    . CYSTOSCOPY W/ URETERAL STENT PLACEMENT Right 12/08/2015   Procedure: CYSTOSCOPY WITH RETROGRADE PYELOGRAM/ RIGHT URETERAL STENT PLACEMENT;  Surgeon: Nickie Retort, MD;  Location: [facility];  Service: Urology;  Laterality: Right;  . CYSTOSCOPY WITH RETROGRADE PYELOGRAM, URETEROSCOPY AND STENT PLACEMENT Right 12/26/2015   Procedure: CYSTOSCOPY WITH RIGHT RETROGRADE PYELOGRAM, URETEROSCOPY AND RIGHT URETERAL STENT EXCHANGE;  Surgeon: Nickie Retort, MD;  Location: [facility];  Service: Urology;  Laterality: Right;  . HOLMIUM LASER APPLICATION Right 2/29/7989   Procedure: HOLMIUM LASER LITHOTRIPSY;   Surgeon: Nickie Retort, MD;  Location: [facility];  Service: Urology;  Laterality: Right;  . LAPAROSCOPIC APPENDECTOMY  05-23-2009  . LEFT URETEROSCOPIC LASER LITHOTRISPY STONE EXTRACTION/  STENT PLACEMENT  01-17-2010  . SURGERY REPAIR HEAD, NECK AND LEFT LEG FROM DOG BITES  31  (age 60)     OB History   No obstetric history on file.     History reviewed. No pertinent family history.  Social History   Tobacco Use  . Smoking status: Former Smoker    Years: 2.00    Types: Cigarettes  . Smokeless tobacco: Never Used  Substance Use Topics  . Alcohol use: No  . Drug use: No    Home Medications Prior to Admission medications   Medication Sig Start Date End Date Taking? Authorizing Provider  guaiFENesin-codeine 100-10 MG/5ML syrup Take 5-10 mLs by mouth every 6 (six) hours as needed for cough. 05/31/16   Ward, Delice Bison, DO  loperamide (IMODIUM) 2 MG capsule Take 1 capsule (2 mg total) by mouth 4 (four) times daily as needed for diarrhea or loose stools. 05/31/16   Ward, Delice Bison, DO  ondansetron (ZOFRAN ODT) 4 MG disintegrating tablet Take 1 tablet (4 mg total) by mouth every 8 (eight) hours as needed for nausea or vomiting. 10/29/19   Lorin Glass, PA-C  oxyCODONE-acetaminophen (PERCOCET/ROXICET) 5-325 MG tablet Take 1 tablet by mouth every 6 (six) hours as needed for severe pain. 10/29/19  Cristina Gong, PA-C  predniSONE (DELTASONE) 20 MG tablet Take 3 tablets (60 mg total) by mouth daily. 05/31/16   Ward, Layla Maw, DO  promethazine (PHENERGAN) 25 MG tablet Take 1 tablet (25 mg total) by mouth every 6 (six) hours as needed for nausea or vomiting. 05/31/16   Ward, Layla Maw, DO    Allergies    Patient has no known allergies.  Review of Systems   Review of Systems  Constitutional: Negative for chills and fever.  Musculoskeletal:       Pain in left wrist.  Skin: Negative for color change, rash and wound.  All other systems reviewed and are  negative.   Physical Exam Updated Vital Signs BP (!) 134/91 (BP Location: Right Arm)   Pulse 86   Temp 98.2 F (36.8 C) (Oral)   Resp 18   Ht 5\' 5"  (1.651 m)   Wt 81.6 kg   LMP 10/24/2019   SpO2 99%   BMI 29.95 kg/m   Physical Exam Vitals and nursing note reviewed.  Constitutional:      General: She is not in acute distress.    Appearance: She is not ill-appearing.  HENT:     Head: Normocephalic.  Cardiovascular:     Rate and Rhythm: Normal rate.     Pulses: Normal pulses.     Comments: 2+ left radial pulse. Pulmonary:     Effort: Pulmonary effort is normal. No respiratory distress.  Musculoskeletal:     Comments: There is mild edema over the dorsum of the left-sided wrist.  There is no crepitus or deformities palpated in the left elbow, upper arm, or shoulder.  Left hand is nontender to palpation.  Localized tenderness to palpation over the left wrist.  Patient is able to move her fingers on the left hand.  Skin:    Comments: No lacerations abrasions or other skin breaks present over the left wrist.  Neurological:     Mental Status: She is alert.     Comments: Sensation intact to left hand to light touch.  Psychiatric:        Mood and Affect: Mood normal.     ED Results / Procedures / Treatments   Labs (all labs ordered are listed, but only abnormal results are displayed) Labs Reviewed - No data to display  EKG None  Radiology DG Wrist Complete Left  Result Date: 10/29/2019 CLINICAL DATA:  Fall while roller-skating EXAM: LEFT WRIST - COMPLETE 3+ VIEW COMPARISON:  None. FINDINGS: There is a nondisplaced, impacted fracture of the distal left radius. Mild soft tissue swelling. IMPRESSION: Nondisplaced, impacted fracture of the distal left radius. Electronically Signed   By: 10/31/2019 M.D.   On: 10/29/2019 18:56    Procedures .Splint Application  Date/Time: 10/29/2019 9:50 PM Performed by: 10/31/2019, PA-C Authorized by: Cristina Gong,  PA-C   Consent:    Consent obtained:  Verbal   Consent given by:  Patient   Risks discussed:  Discoloration, numbness, pain and swelling   Alternatives discussed:  No treatment, alternative treatment and referral Pre-procedure details:    Sensation:  Normal   Skin color:  Normal, similar to right hand/wrist. Procedure details:    Laterality:  Left   Location:  Wrist   Wrist:  L wrist   Strapping: no     Cast type:  Short arm   Splint type:  Sugar tong   Supplies:  Elastic bandage, Ortho-Glass and cotton padding Post-procedure details:  Pain:  Improved   Sensation:  Normal   Skin color:  Unchanged.   Patient tolerance of procedure:  Tolerated well, no immediate complications Comments:     Splint and sling were applied by Ortho tech   (including critical care time)  Medications Ordered in ED Medications  ondansetron (ZOFRAN-ODT) disintegrating tablet 4 mg (4 mg Oral Given 10/29/19 1941)  oxyCODONE-acetaminophen (PERCOCET/ROXICET) 5-325 MG per tablet 1 tablet (1 tablet Oral Given 10/29/19 1941)    ED Course  I have reviewed the triage vital signs and the nursing notes.  Pertinent labs & imaging results that were available during my care of the patient were reviewed by me and considered in my medical decision making (see chart for details).    MDM Rules/Calculators/A&P                     Patient presents today for evaluation of a fall while rollerskating with resulting pain in her left wrist.  X-rays were obtained showing a left-sided distal radial fracture with mild compression.  X-rays were reviewed personally by myself, no indication for manipulation in the emergency room at this time as there is minimal displacement and would most likely not receive any benefit from attempted ED manipulation.  Patient is neurovascularly intact.  Sling was given and arm was splinted.  Patient was neurovascularly intact after splinting. Her pain and nausea were treated in the emergency room  with Percocet and Zofran.  She is given a prescription for both of these at home after [facility] PMP is consulted. She is given hand follow-up. She denies concern for any other injury from this fall which was mechanical, nonsyncopal.  She does not take any blood thinning medications and did not strike her head.  Return precautions were discussed with patient who states their understanding.  At the time of discharge patient denied any unaddressed complaints or concerns.  Patient is agreeable for discharge home.  Note: Portions of this report may have been transcribed using voice recognition software. Every effort was made to ensure accuracy; however, inadvertent computerized transcription errors may be present  Final Clinical Impression(s) / ED Diagnoses Final diagnoses:  Closed fracture of distal end of left radius, unspecified fracture morphology, initial encounter  Fall, initial encounter    Rx / DC Orders ED Discharge Orders         Ordered    oxyCODONE-acetaminophen (PERCOCET/ROXICET) 5-325 MG tablet  Every 6 hours PRN     10/29/19 2036    ondansetron (ZOFRAN ODT) 4 MG disintegrating tablet  Every 8 hours PRN     10/29/19 2036           Cristina Gong, PA-C 10/29/19 2153    Pricilla Loveless, MD 10/30/19 0025

## 2022-02-11 ENCOUNTER — Telehealth (HOSPITAL_COMMUNITY): Payer: Self-pay | Admitting: Emergency Medicine

## 2022-02-11 ENCOUNTER — Emergency Department (HOSPITAL_COMMUNITY): Payer: 59

## 2022-02-11 ENCOUNTER — Encounter (HOSPITAL_COMMUNITY): Payer: Self-pay

## 2022-02-11 ENCOUNTER — Emergency Department (HOSPITAL_COMMUNITY)
Admission: EM | Admit: 2022-02-11 | Discharge: 2022-02-11 | Disposition: A | Discharge status: Home / Self Care | Payer: 59 | Attending: Emergency Medicine | Admitting: Emergency Medicine

## 2022-02-11 ENCOUNTER — Other Ambulatory Visit: Payer: Self-pay

## 2022-02-11 DIAGNOSIS — R1032 Left lower quadrant pain: Secondary | ICD-10-CM | POA: Diagnosis present

## 2022-02-11 DIAGNOSIS — N132 Hydronephrosis with renal and ureteral calculous obstruction: Secondary | ICD-10-CM | POA: Insufficient documentation

## 2022-02-11 DIAGNOSIS — Z79899 Other long term (current) drug therapy: Secondary | ICD-10-CM | POA: Insufficient documentation

## 2022-02-11 DIAGNOSIS — N200 Calculus of kidney: Secondary | ICD-10-CM

## 2022-02-11 LAB — BASIC METABOLIC PANEL
Anion gap: 6 (ref 5–15)
BUN: 11 mg/dL (ref 6–20)
CO2: 23 mmol/L (ref 22–32)
Calcium: 9.7 mg/dL (ref 8.9–10.3)
Chloride: 109 mmol/L (ref 98–111)
Creatinine, Ser: 0.7 mg/dL (ref 0.44–1.00)
GFR, Estimated: >60 mL/min (ref >60)
Glucose, Bld: 143 mg/dL — ABNORMAL HIGH (ref 70–99)
Potassium: 3.4 mmol/L — ABNORMAL LOW (ref 3.5–5.1)
Sodium: 138 mmol/L (ref 135–145)

## 2022-02-11 LAB — URINALYSIS, ROUTINE W REFLEX MICROSCOPIC
Bilirubin Urine: NEGATIVE
Glucose, UA: NEGATIVE mg/dL
Ketones, ur: NEGATIVE mg/dL
Leukocytes,Ua: NEGATIVE
Nitrite: NEGATIVE
Protein, ur: 30 mg/dL — AB
RBC / HPF: >50 RBC/hpf — ABNORMAL HIGH (ref 0–5)
Specific Gravity, Urine: 1.012 (ref 1.005–1.030)
pH: 6 (ref 5.0–8.0)

## 2022-02-11 LAB — CBC
HCT: 37.8 % (ref 36.0–46.0)
Hemoglobin: 13.3 g/dL (ref 12.0–15.0)
MCH: 30.4 pg (ref 26.0–34.0)
MCHC: 35.2 g/dL (ref 30.0–36.0)
MCV: 86.5 fL (ref 80.0–100.0)
Platelets: 313 10*3/uL (ref 150–400)
RBC: 4.37 MIL/uL (ref 3.87–5.11)
RDW: 13.2 % (ref 11.5–15.5)
WBC: 10.2 10*3/uL (ref 4.0–10.5)
nRBC: 0 % (ref 0.0–0.2)

## 2022-02-11 LAB — I-STAT BETA HCG BLOOD, ED (MC, WL, AP ONLY): I-stat hCG, quantitative: <5 m[IU]/mL (ref <5)

## 2022-02-11 MED ORDER — ONDANSETRON 4 MG PO TBDP: 4.0000 mg | ORAL_TABLET | Freq: Three times a day (TID) | ORAL | 0 refills | Status: DC | PRN

## 2022-02-11 MED ORDER — FENTANYL CITRATE PF 50 MCG/ML IJ SOSY
50.0000 ug | PREFILLED_SYRINGE | Freq: Once | INTRAMUSCULAR | Status: AC
  Administered 2022-02-11: 50 ug via INTRAVENOUS
  Filled 2022-02-11: qty 1

## 2022-02-11 MED ORDER — SODIUM CHLORIDE 0.9 % IV BOLUS
500.0000 mL | Freq: Once | INTRAVENOUS | Status: AC
Start: 2022-02-11 — End: 2022-02-11
  Administered 2022-02-11: 500 mL via INTRAVENOUS

## 2022-02-11 MED ORDER — ONDANSETRON HCL 4 MG/2ML IJ SOLN
4.0000 mg | Freq: Once | INTRAMUSCULAR | Status: AC
  Administered 2022-02-11: 4 mg via INTRAVENOUS
  Filled 2022-02-11: qty 2

## 2022-02-11 MED ORDER — TAMSULOSIN HCL 0.4 MG PO CAPS: 0.4000 mg | ORAL_CAPSULE | Freq: Every day | ORAL | 0 refills | Status: DC

## 2022-02-11 MED ORDER — OXYCODONE-ACETAMINOPHEN 5-325 MG PO TABS: 1.0000 | ORAL_TABLET | Freq: Four times a day (QID) | ORAL | 0 refills | Status: DC | PRN

## 2022-02-11 MED ORDER — ONDANSETRON 4 MG PO TBDP: 4.0000 mg | ORAL_TABLET | Freq: Three times a day (TID) | ORAL | 0 refills | Status: AC | PRN

## 2022-02-11 MED ORDER — KETOROLAC TROMETHAMINE 15 MG/ML IJ SOLN
15.0000 mg | Freq: Once | INTRAMUSCULAR | Status: AC
  Administered 2022-02-11: 15 mg via INTRAVENOUS
  Filled 2022-02-11: qty 1

## 2022-02-11 MED ORDER — TAMSULOSIN HCL 0.4 MG PO CAPS: 0.4000 mg | ORAL_CAPSULE | Freq: Every day | ORAL | 0 refills | Status: AC

## 2022-02-11 NOTE — Discharge Instructions (Addendum)
You are seen in the ER today for your left-sided flank pain and discomfort with urination.  You have what appears to be 2 small kidney stone in your bladder.  The prescribed medication to encourage you to pass the stones, pain medication, and nausea medication.  Please call your urologist to schedule outpatient follow-up.  Drink lots of fluids, and return to the ER with any fevers, chills, blood in your urine, or any other new severe symptom.

## 2022-02-11 NOTE — ED Triage Notes (Addendum)
Pt presents to ED from home with c/o left side flank pain radiating to the back. Pt states she has been going to the bathroom more frequently and states urination is painful. Endorses N/V, Hx kidney stones

## 2022-02-11 NOTE — ED Provider Notes (Signed)
[facility]-EMERGENCY DEPT Provider Note   CSN: [id number] Arrival date & time: 02/11/22  0038     History  Chief Complaint  Patient presents with   Flank Pain    CODI [name] is a 31 y.o. female with history of nephrolithiasis who presents with concern for 36 hours of left-sided flank pain that radiates down to her lower abdomen, with dysuria, nausea, and vomiting with NBNB emesis.  Denies any fevers, chills, hematuria.  She presents with her wife [name] at the bedside.  I have personally reviewed this patient's medical records.  She has history of above list ed nephrolithiasis and asthma.  She is not on any medications daily.  Has required Cystoscopy with ureteral stent placement for large stones in the past measuring 12 mm.  HPI     Home Medications Prior to Admission medications   Medication Sig Start Date End Date Taking? Authorizing Provider  Acetaminophen (TYLENOL PO) Take 1 tablet by mouth every 6 (six) hours as needed (pain).   Yes [provider]  ondansetron (ZOFRAN-ODT) 4 MG disintegrating tablet Take 1 tablet (4 mg total) by mouth every 8 (eight) hours as needed for nausea or vomiting. 02/11/22  Yes Milika Ventress, Eugene Gavia, PA-C  oxyCODONE-acetaminophen (PERCOCET/ROXICET) 5-325 MG tablet Take 1 tablet by mouth every 6 (six) hours as needed for severe pain. 02/11/22  Yes Ajooni Karam, Eugene Gavia, PA-C  tamsulosin (FLOMAX) 0.4 MG CAPS capsule Take 1 capsule (0.4 mg total) by mouth daily. 02/11/22  Yes Amaia Lavallie R, PA-C  guaiFENesin-codeine 100-10 MG/5ML syrup Take 5-10 mLs by mouth every 6 (six) hours as needed for cough. Patient not taking: Reported on 02/11/2022 05/31/16   Ward, Layla Maw, DO  loperamide (IMODIUM) 2 MG capsule Take 1 capsule (2 mg total) by mouth 4 (four) times daily as needed for diarrhea or loose stools. Patient not taking: Reported on 02/11/2022 05/31/16   Ward, Layla Maw, DO  predniSONE (DELTASONE) 20 MG tablet Take 3 tablets  (60 mg total) by mouth daily. Patient not taking: Reported on 02/11/2022 05/31/16   Ward, Layla Maw, DO  promethazine (PHENERGAN) 25 MG tablet Take 1 tablet (25 mg total) by mouth every 6 (six) hours as needed for nausea or vomiting. Patient not taking: Reported on 02/11/2022 05/31/16   Ward, Layla Maw, DO      Allergies    Patient has no known allergies.    Review of Systems   Review of Systems  Constitutional:  Positive for appetite change. Negative for fatigue and fever.  Gastrointestinal:  Positive for abdominal pain, nausea and vomiting.  Genitourinary:  Positive for dysuria and flank pain.   Physical Exam Updated Vital Signs BP 130/77   Pulse 73   Temp 98.4 F (36.9 C) (Oral)   Resp 16   Ht 5\' 5"  (1.651 m)   Wt 77.1 kg   SpO2 99%   BMI 28.29 kg/m  Physical Exam Vitals and nursing note reviewed.  Constitutional:      Appearance: She is not ill-appearing or toxic-appearing.  HENT:     Head: Normocephalic and atraumatic.     Mouth/Throat:     Mouth: Mucous membranes are moist.     Pharynx: No oropharyngeal exudate or posterior oropharyngeal erythema.  Eyes:     General:        Right eye: No discharge.        Left eye: No discharge.     Conjunctiva/sclera: Conjunctivae normal.     Pupils: Pupils  are equal, round, and reactive to light.  Cardiovascular:     Rate and Rhythm: Normal rate and regular rhythm.     Pulses: Normal pulses.     Heart sounds: Normal heart sounds. No murmur heard. Pulmonary:     Effort: Pulmonary effort is normal. No respiratory distress.     Breath sounds: Normal breath sounds. No wheezing or rales.  Abdominal:     General: Bowel sounds are normal. There is no distension.     Palpations: Abdomen is soft.     Tenderness: There is abdominal tenderness in the suprapubic area. There is left CVA tenderness. There is no right CVA tenderness, guarding or rebound.  Musculoskeletal:        General: No deformity.     Cervical back: Neck supple.      Right lower leg: No edema.     Left lower leg: No edema.  Skin:    General: Skin is warm and dry.     Capillary Refill: Capillary refill takes less than 2 seconds.  Neurological:     General: No focal deficit present.     Mental Status: She is alert and oriented to person, place, and time. Mental status is at baseline.  Psychiatric:        Mood and Affect: Mood normal.    ED Results / Procedures / Treatments   Labs (all labs ordered are listed, but only abnormal results are displayed) Labs Reviewed  BASIC METABOLIC PANEL - Abnormal; Notable for the following components:      Result Value   Potassium 3.4 (*)    Glucose, Bld 143 (*)    All other components within normal limits  URINALYSIS, ROUTINE W REFLEX MICROSCOPIC - Abnormal; Notable for the following components:   Hgb urine dipstick LARGE (*)    Protein, ur 30 (*)    RBC / HPF >50 (*)    Bacteria, UA RARE (*)    All other components within normal limits  CBC  I-STAT BETA HCG BLOOD, ED ([facility], [facility], [facility] ONLY)    EKG None  Radiology CT RENAL STONE STUDY  Result Date: 02/11/2022 CLINICAL DATA:  Left-sided flank pain EXAM: CT ABDOMEN AND PELVIS WITHOUT CONTRAST TECHNIQUE: Multidetector CT imaging of the abdomen and pelvis was performed following the standard protocol without IV contrast. RADIATION DOSE REDUCTION: This exam was performed according to the departmental dose-optimization program which includes automated exposure control, adjustment of the mA and/or kV according to patient size and/or use of iterative reconstruction technique. COMPARISON:  CT 12/08/2015 FINDINGS: Lower chest: Lung bases demonstrate no acute consolidation or effusion. Hepatobiliary: No focal liver abnormality is seen. No gallstones, gallbladder wall thickening, or biliary dilatation. Pancreas: Unremarkable. No pancreatic ductal dilatation or surrounding inflammatory changes. Spleen: Normal in size without focal abnormality. Adrenals/Urinary Tract: Adrenal  glands are within normal limits. Mild left hydronephrosis and hydroureter, secondary to 2 adjacent or single larger stone at the left UVJ measuring 7 mm. Punctate intrarenal stones bilaterally. Low-density lesion mid pole left kidney possible cyst but cannot further characterize without contrast. Stomach/Bowel: Stomach is within normal limits. Status post appendectomy. No evidence of bowel wall thickening, distention, or inflammatory changes. Vascular/Lymphatic: Nonaneurysmal aorta.  No suspicious lymph node Reproductive: Uterus and bilateral adnexa are unremarkable. Other: Negative for pelvic effusion or free air Musculoskeletal: No acute osseous abnormality IMPRESSION: 1. Mild left hydronephrosis and hydroureter secondary to single large versus 2 adjacent smaller stones at the left UVJ measuring 7 mm aggregate. 2. Punctate intrarenal  stones bilaterally Electronically Signed   By: Jasmine Pang M.D.   On: 02/11/2022 02:08    Procedures Procedures    Medications Ordered in ED Medications  ondansetron (ZOFRAN) injection 4 mg (4 mg Intravenous Given 02/11/22 0442)  ketorolac (TORADOL) 15 MG/ML injection 15 mg (15 mg Intravenous Given 02/11/22 0442)  fentaNYL (SUBLIMAZE) injection 50 mcg (50 mcg Intravenous Given 02/11/22 0442)  sodium chloride 0.9 % bolus 500 mL (0 mLs Intravenous Stopped 02/11/22 0535)    ED Course/ Medical Decision Making/ A&P                           Medical Decision Making 31 year old female who presents with concern for left flank pain x 2 days.   The differential diagnosis of emergent flank pain includes, but is not limited to: Nephrolithiasis/ Renal Colic, Pyelonephritis, Abdominal aortic aneurysm, Aortic dissection, Renal artery embolism, Renal vein thrombosis, Renal infarction, Renal hemorrhage, Mesenteric ischemia, Bladder tumor, Cystitis, Biliary colic, Pancreatitis, Perforated peptic ulcer,  Appendicitis, Inguinal Hernia, Diverticulitis, Bowel obstruction. Shingles, Lower lobe  pneumonia, Retroperitoneal hematoma/abscess/tumor, Epidural abscess, Epidural hematoma.  Particularly in females it is important to consider Ectopic Pregnancy,PID/TOA,Ovarian cyst, Ovarian torsion, STD.  Hypertensive on intake, vital signs otherwise normal.  Cardiopulmonary exam is normal, abdominal exam as above with left CVA tenderness and suprapubic tenderness palpation on exam.  No rebound or guarding.  No vomiting throughout exam.    Amount and/or Complexity of Data Reviewed Labs: ordered.    Details: CBC without leukocytosis or anemia.  BMP with normal renal function and mild hypokalemia 3.4.See patient is not pregnant, and UA significant for large hemoglobin, proteinuria but not concerning for infection. Radiology: ordered.    Details: CT renal study obtained from triage which revealed 1 single large versus 2 smaller adjacent stones at the left UVJ measuring 7 mm in total with associated left hydronephrosis and hydroureter.  Images visualized by this provider.  Risk Prescription drug management.   Patient reevaluated after IV analgesia and antiemetic with significant improvement in her symptoms.  Given lack of infected stone, will plan to discharge patient with tamsulosin, nausea and pain medication to allow her to attempt to pass the stones independently.  She will plan to call her urologist this week.  No further work-up warranted near this time.  Patient tolerating p.o. with normal hemodynamics at this time.  Clinical concern for more emergent underlying etiology would warrant further ED work-up or inpatient management is exceedingly low.  Heloise Purpura and her wife voiced understanding of her medical evaluation and treatment plan. Each of their questions answered to their expressed satisfaction.  Return precautions were given.  Patient is well-appearing, stable, and was discharged in good condition.  This chart was dictated using voice recognition software, Dragon. Despite the best efforts  of this provider to proofread and correct errors, errors may still occur which can change documentation meaning.   Final Clinical Impression(s) / ED Diagnoses Final diagnoses:  Nephrolithiasis    Rx / DC Orders ED Discharge Orders          Ordered    tamsulosin (FLOMAX) 0.4 MG CAPS capsule  Daily        02/11/22 0428    ondansetron (ZOFRAN-ODT) 4 MG disintegrating tablet  Every 8 hours PRN        02/11/22 0526    oxyCODONE-acetaminophen (PERCOCET/ROXICET) 5-325 MG tablet  Every 6 hours PRN        02/11/22 0526  Paris Lore, PA-C 02/11/22 [id number]    Dione Booze, MD 02/11/22 [phone]

## 2022-02-12 NOTE — Telephone Encounter (Signed)
Prescriptions changed to different pharmacy per patient request.

## 2023-01-11 IMAGING — CT CT RENAL STONE PROTOCOL
2 of 4 series · 16 of 46 positions shown, 18 images · non-contrast
Comparison: CT 12/08/2015

CLINICAL DATA: Left-sided flank pain



[Series 2: axial st · axial · 0.89mm/px · z∈[+1144,+1538]mm · 13 of 91 slices shown, 15 images]
[im 6/91  soft-tissue]
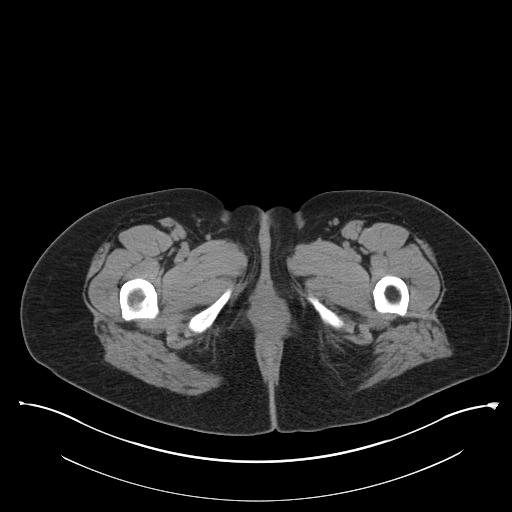
[im 6/91  bone]
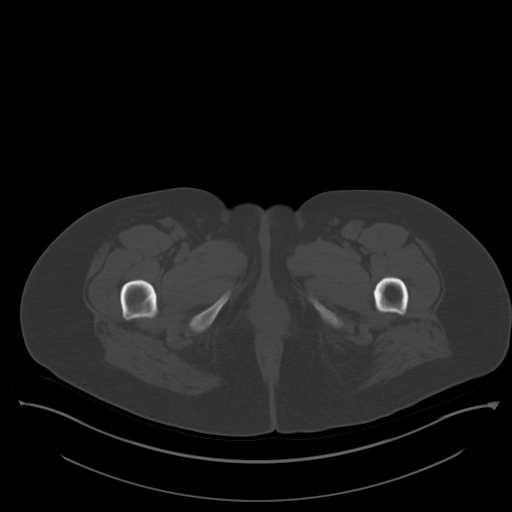
[im 11/91  soft-tissue]
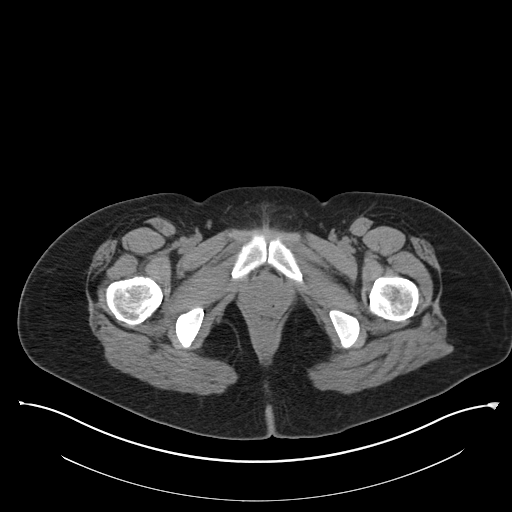
[im 22/91  soft-tissue]
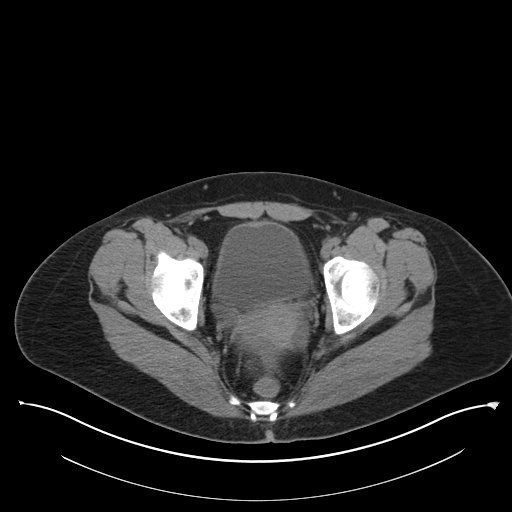
[im 27/91  soft-tissue]
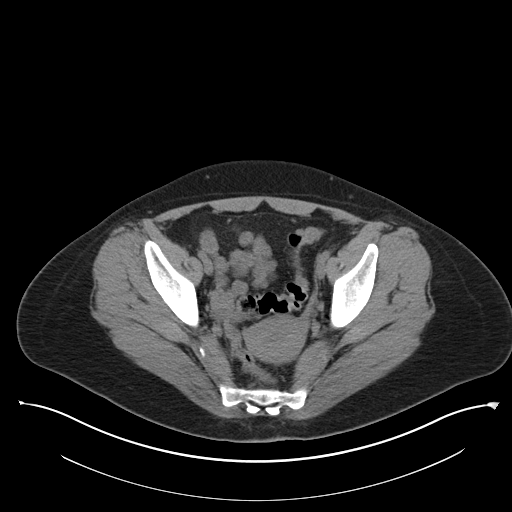
[im 32/91  soft-tissue]
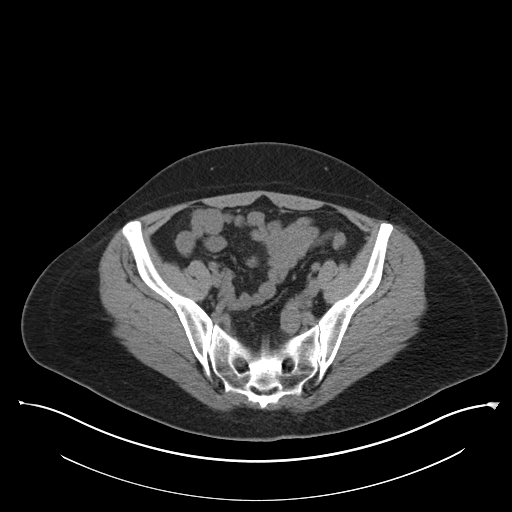
[im 38/91  soft-tissue]
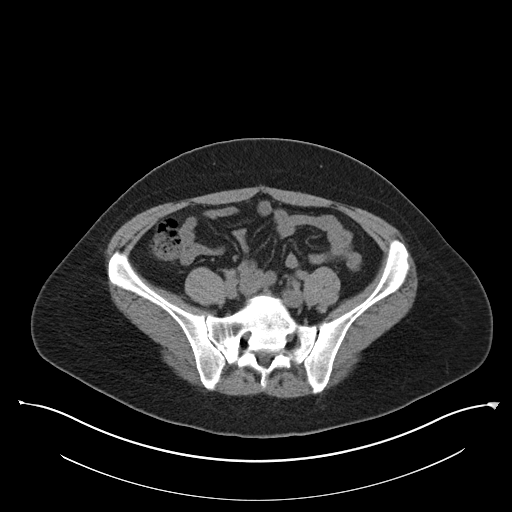
[im 48/91  soft-tissue]
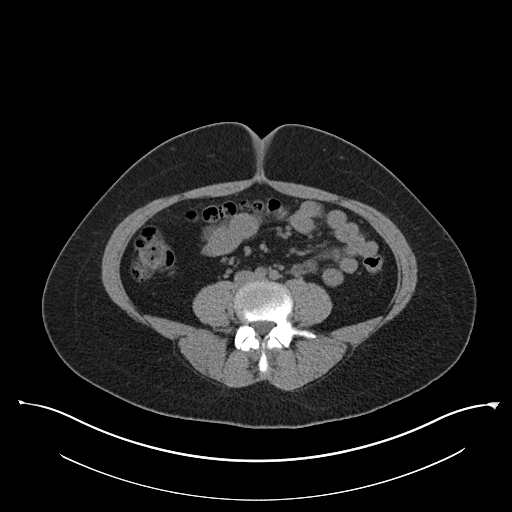
[im 53/91  soft-tissue]
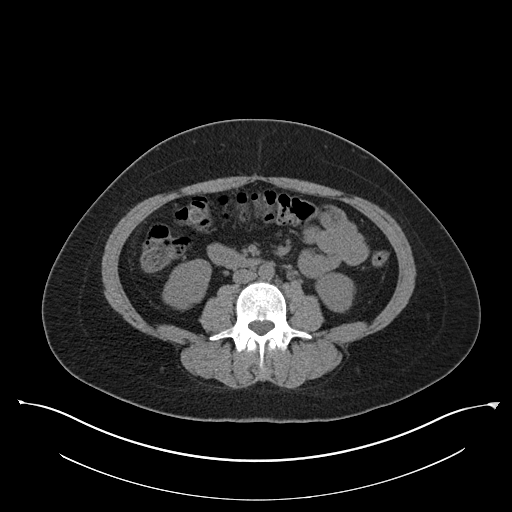
[im 59/91  soft-tissue]
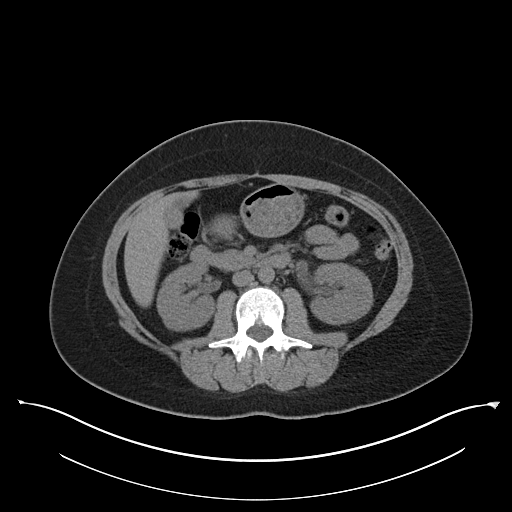
[im 59/91  bone]
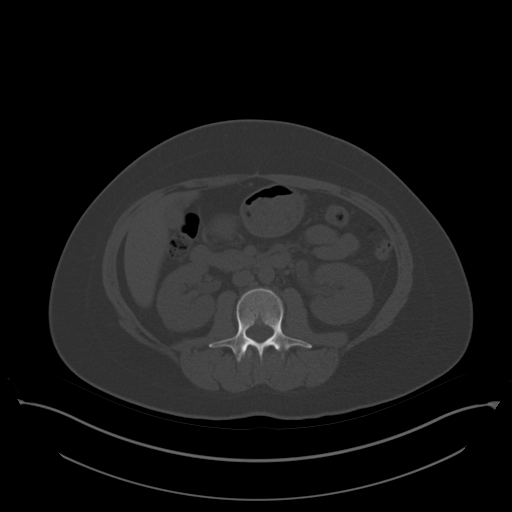
[im 64/91  soft-tissue]
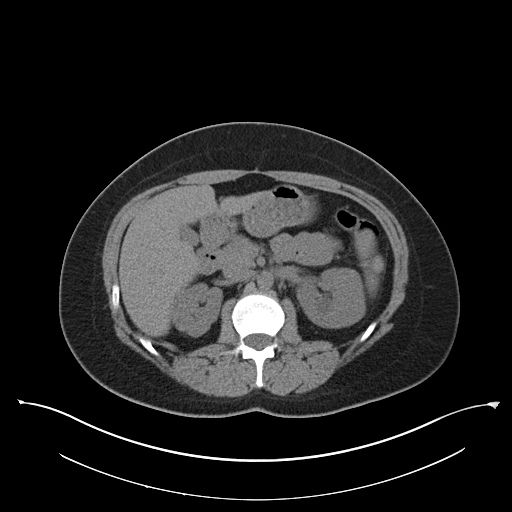
[im 69/91  soft-tissue]
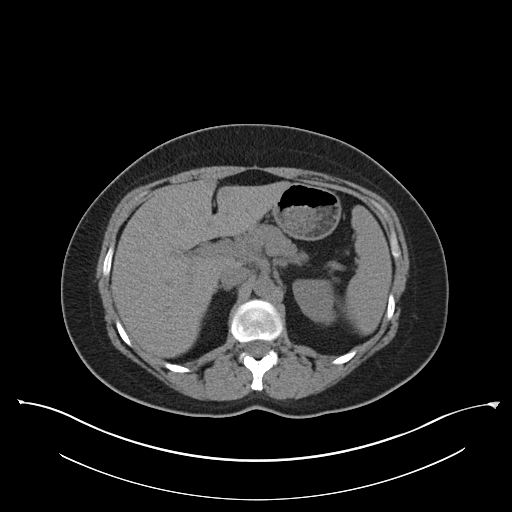
[im 80/91  soft-tissue]
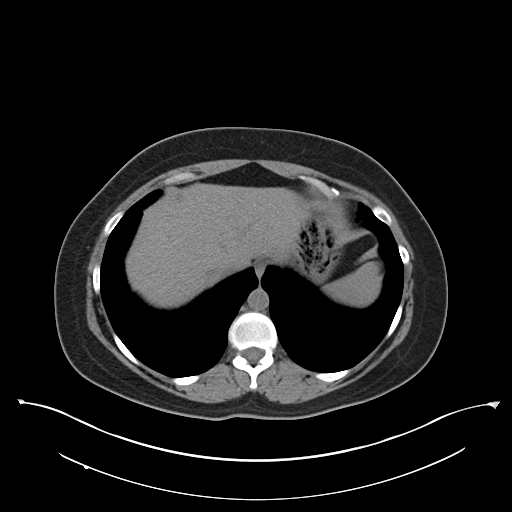
[im 85/91  soft-tissue]
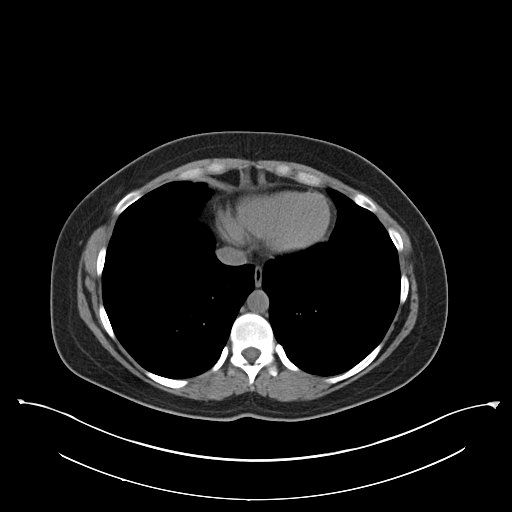

[Series 5: coronal · coronal · 0.77mm/px · 3 of 146 slices shown]
[im 49/146  soft-tissue]
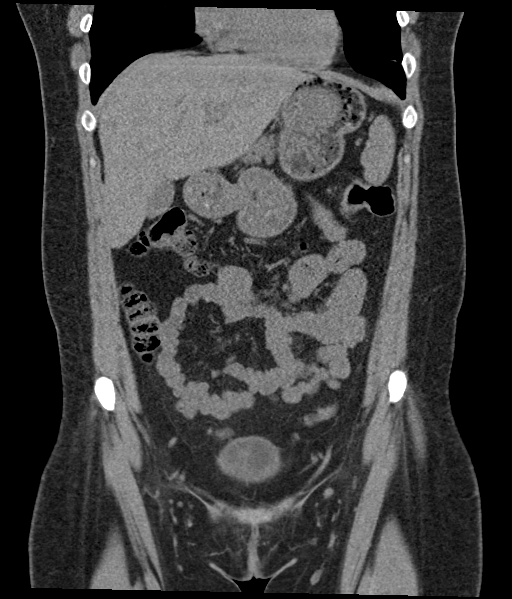
[im 65/146  soft-tissue]
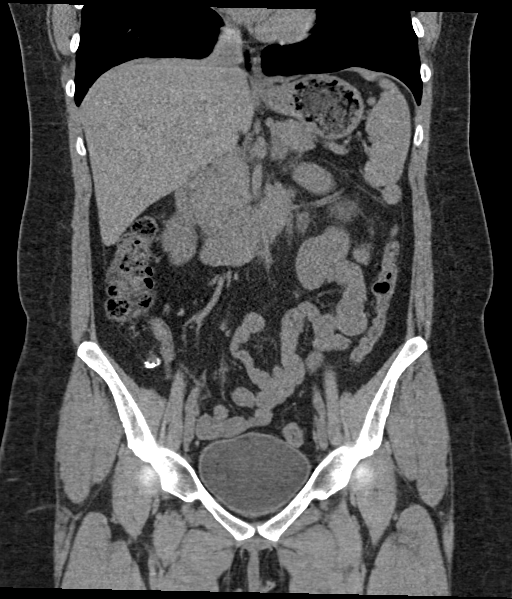
[im 81/146  soft-tissue]
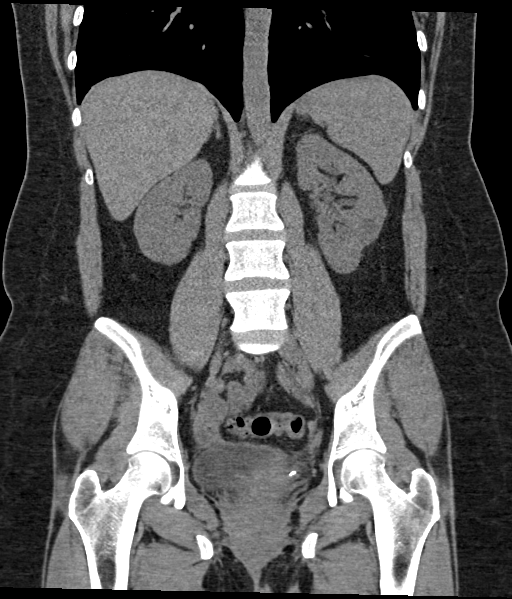

[16 of 46 positions shown; findings below may reference images not displayed]

FINDINGS: Lower chest: Lung bases demonstrate no acute consolidation or
effusion.

Hepatobiliary: No focal liver abnormality is seen. No gallstones,
gallbladder wall thickening, or biliary dilatation.

Pancreas: Unremarkable. No pancreatic ductal dilatation or
surrounding inflammatory changes.

Spleen: Normal in size without focal abnormality.

Adrenals/Urinary Tract: Adrenal glands are within normal limits.
Mild left hydronephrosis and hydroureter, secondary to 2 adjacent or
single larger stone at the left UVJ measuring 7 mm. Punctate
intrarenal stones bilaterally. Low-density lesion mid pole left
kidney possible cyst but cannot further characterize without
contrast.

Stomach/Bowel: Stomach is within normal limits. Status post
appendectomy. No evidence of bowel wall thickening, distention, or
inflammatory changes.

Vascular/Lymphatic: Nonaneurysmal aorta.  No suspicious lymph node

Reproductive: Uterus and bilateral adnexa are unremarkable.

Other: Negative for pelvic effusion or free air

Musculoskeletal: No acute osseous abnormality
IMPRESSION: 1. Mild left hydronephrosis and hydroureter secondary to single
large versus 2 adjacent smaller stones at the left UVJ measuring 7
mm aggregate.
2. Punctate intrarenal stones bilaterally

## 2024-02-05 ENCOUNTER — Ambulatory Visit: Admitting: Podiatry

## 2024-02-05 ENCOUNTER — Ambulatory Visit (INDEPENDENT_AMBULATORY_CARE_PROVIDER_SITE_OTHER)

## 2024-02-05 ENCOUNTER — Telehealth: Payer: Self-pay | Admitting: Podiatry

## 2024-02-05 ENCOUNTER — Encounter: Payer: Self-pay | Admitting: Podiatry

## 2024-02-05 VITALS — Ht 65.0 in | Wt 170.0 lb

## 2024-02-05 DIAGNOSIS — M778 Other enthesopathies, not elsewhere classified: Secondary | ICD-10-CM

## 2024-02-05 DIAGNOSIS — M722 Plantar fascial fibromatosis: Secondary | ICD-10-CM

## 2024-02-05 MED ORDER — DICLOFENAC SODIUM 75 MG PO TBEC: 75.0000 mg | DELAYED_RELEASE_TABLET | Freq: Two times a day (BID) | ORAL | 0 refills | Status: DC

## 2024-02-05 MED ORDER — MELOXICAM 15 MG PO TABS: 15.0000 mg | ORAL_TABLET | Freq: Every day | ORAL | 0 refills | Status: AC

## 2024-02-05 MED ORDER — DICLOFENAC SODIUM 75 MG PO TBEC: 75.0000 mg | DELAYED_RELEASE_TABLET | Freq: Two times a day (BID) | ORAL | 2 refills | Status: DC

## 2024-02-05 MED ORDER — TRIAMCINOLONE ACETONIDE 10 MG/ML IJ SUSP
10.0000 mg | Freq: Once | INTRAMUSCULAR | Status: AC
  Administered 2024-02-05: 10 mg via INTRA_ARTICULAR

## 2024-02-05 NOTE — Patient Instructions (Signed)

## 2024-02-05 NOTE — Telephone Encounter (Signed)
 Patient stated order for prescription Diclofenac (VOLTAREN) 75 MG EC Tablet was sent to incorrect pharmacy./Correct pharmacy; [facility] on [address].

## 2024-02-06 NOTE — Progress Notes (Signed)
 Subjective:   Patient ID: Elizabeth Joyce, female   DOB: 33 y.o.   MRN: [id number]   HPI Patient presents with severe pain in the left plantar heel of several months duration and states that does not remember specific injury.  Also has painful bunion deformity left over right which is very sore make shoe gear difficult and knows she needs to get it corrected.  Patient does not smoke likes to be active   Review of Systems  All other systems reviewed and are negative.       Objective:  Physical Exam Vitals and nursing note reviewed.  Constitutional:      Appearance: She is well-developed.  Pulmonary:     Effort: Pulmonary effort is normal.  Musculoskeletal:        General: Normal range of motion.  Skin:    General: Skin is warm.  Neurological:     Mental Status: She is alert.     Neurovascular status intact muscle strength adequate range of motion adequate with exquisite discomfort noted left plantar fascia at the insertional point tendon calcaneus with fluid buildup depression of the arch bilateral and noted to have hyperostosis with redness around the first metatarsal head left over right.  Good digital perfusion well-oriented x 3     Assessment:  Acute plantar fasciitis left along with structural bunion deformity left over right     Plan:  H&P reviewed recommended treatment of the heel first and long-term will require bunion correction.  I went ahead today reviewed x-ray then did sterile prep injected the plantar fascia at insertion 3 mg Kenalog 5 mg Xylocaine  applied fascial brace to lift up the arch gave instructions on stretching and reappoint to recheck again in the next several weeks and at 1 point we will discuss the bunion  X-rays indicate spur formation plantar aspect left heel no indication stress fracture or arthritis moderate depression of the arch and elevation of the 1 2 intermetatarsal angle of approximate 15 degrees

## 2024-02-19 ENCOUNTER — Ambulatory Visit: Admitting: Podiatry

## 2024-02-19 ENCOUNTER — Encounter: Payer: Self-pay | Admitting: Podiatry

## 2024-02-19 DIAGNOSIS — M722 Plantar fascial fibromatosis: Secondary | ICD-10-CM

## 2024-02-19 DIAGNOSIS — M21619 Bunion of unspecified foot: Secondary | ICD-10-CM

## 2024-02-20 NOTE — Progress Notes (Signed)
 Subjective:   Patient ID: Elizabeth Joyce, female   DOB: 33 y.o.   MRN: [id number]   HPI Patient states that she is improved quite a bit still having discomfort in her foot but better than it was previously.  Also still concerned about structural deformity first metatarsal   ROS      Objective:  Physical Exam  Neurovascular status intact good digital perfusion moderate flatfoot deformity inflammation of the plantar heel which is about 70% better at this point mild discomfort with structural prominence first metatarsal head left over right moderate redness     Assessment:  2 separate problems with 1 being plantar fasciitis secondarily structural bunion deformity left over right     Plan:  H&P reviewed both conditions for plantar fascia continue support shoes continue ice therapy and stretch.  Discussed bunion correction she wants to get this done but this is not a good time so we discussed the recovery from bunion surgery of the distal nature and she will schedule as symptoms indicate

## 2024-07-21 ENCOUNTER — Encounter: Payer: Self-pay | Admitting: Podiatry

## 2024-07-21 ENCOUNTER — Ambulatory Visit: Admitting: Podiatry

## 2024-07-21 DIAGNOSIS — M722 Plantar fascial fibromatosis: Secondary | ICD-10-CM

## 2024-07-21 MED ORDER — TRIAMCINOLONE ACETONIDE 10 MG/ML IJ SUSP
10.0000 mg | Freq: Once | INTRAMUSCULAR | Status: AC
  Administered 2024-07-21: 10 mg via INTRA_ARTICULAR

## 2024-07-21 MED ORDER — DICLOFENAC SODIUM 75 MG PO TBEC: 75.0000 mg | DELAYED_RELEASE_TABLET | Freq: Two times a day (BID) | ORAL | 2 refills | Status: AC

## 2024-07-22 NOTE — Progress Notes (Signed)
 Subjective:   Patient ID: Elizabeth Joyce, female   DOB: 33 y.o.   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  States that it is hard for her to stretch her foot and that it seems bad after she sits and when getting up in the morning   ROS      Objective:  Physical Exam  Acute discomfort noted medial band left plantar fascia at the insertion of the tendon calcaneus with fluid buildup noted and noted to have moderate equinus condition left      Assessment:  Acute plantar fasciitis left along with inability to stretch the foot properly     Plan:  H&P reviewed and at this point I have recommended injection treatment sterile prep injected the plantar fascia left 3 mg Kenalog  5 mg Xylocaine  with sterile dressing and I went ahead and I applied night splint properly fitted to the lower leg to stretch the foot and utilization of ice as needed.  Patient will be seen back may require other treatments depending on response
